# Patient Record
Sex: Female | Born: 1965
Health system: Southern US, Community
[De-identification: ages and names within clinical notes are randomized; demographics above are authoritative.]

## PROBLEM LIST (undated history)

## (undated) DIAGNOSIS — R7309 Other abnormal glucose: Secondary | ICD-10-CM

## (undated) DIAGNOSIS — A6000 Herpesviral infection of urogenital system, unspecified: Secondary | ICD-10-CM

## (undated) DIAGNOSIS — N3281 Overactive bladder: Secondary | ICD-10-CM

## (undated) DIAGNOSIS — E785 Hyperlipidemia, unspecified: Secondary | ICD-10-CM

## (undated) DIAGNOSIS — J302 Other seasonal allergic rhinitis: Secondary | ICD-10-CM

## (undated) DIAGNOSIS — B009 Herpesviral infection, unspecified: Secondary | ICD-10-CM

## (undated) DIAGNOSIS — Z860100 Personal history of colon polyps, unspecified: Secondary | ICD-10-CM

## (undated) DIAGNOSIS — Z8619 Personal history of other infectious and parasitic diseases: Secondary | ICD-10-CM

## (undated) HISTORY — PX: MYOMECTOMY: SHX85

## (undated) HISTORY — PX: REFRACTIVE SURGERY: SHX103

## (undated) HISTORY — DX: Other seasonal allergic rhinitis: J30.2

## (undated) HISTORY — DX: Personal history of other infectious and parasitic diseases: Z86.19

## (undated) HISTORY — PX: UPPER GI ENDOSCOPY: SHX6162

## (undated) HISTORY — DX: Overactive bladder: N32.81

## (undated) HISTORY — DX: Personal history of colonic polyps: Z86.010

## (undated) HISTORY — DX: Other abnormal glucose: R73.09

## (undated) HISTORY — PX: DILATION AND CURETTAGE OF UTERUS: SHX78

## (undated) HISTORY — PX: COLONOSCOPY: SHX174

## (undated) HISTORY — DX: Hyperlipidemia, unspecified: E78.5

## (undated) HISTORY — DX: Herpesviral infection, unspecified: B00.9

## (undated) HISTORY — DX: Herpesviral infection of urogenital system, unspecified: A60.00

## (undated) HISTORY — DX: Personal history of colon polyps, unspecified: Z86.0100

---
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
## 1998-09-08 ENCOUNTER — Other Ambulatory Visit: Admission: RE | Admit: 1998-09-08 | Discharge: 1998-09-08 | Payer: Self-pay | Admitting: Gynecology

## 1998-12-08 ENCOUNTER — Other Ambulatory Visit: Admission: RE | Admit: 1998-12-08 | Discharge: 1998-12-08 | Payer: Self-pay | Admitting: Gynecology

## 1999-08-26 ENCOUNTER — Other Ambulatory Visit: Admission: RE | Admit: 1999-08-26 | Discharge: 1999-08-26 | Payer: Self-pay | Admitting: Gynecology

## 1999-11-01 HISTORY — PX: BREAST BIOPSY: SHX20

## 1999-11-10 ENCOUNTER — Encounter: Admission: RE | Admit: 1999-11-10 | Discharge: 2000-02-08 | Payer: Self-pay | Admitting: Gynecology

## 1999-11-26 ENCOUNTER — Encounter (INDEPENDENT_AMBULATORY_CARE_PROVIDER_SITE_OTHER): Payer: Self-pay | Admitting: *Deleted

## 1999-11-26 ENCOUNTER — Encounter: Payer: Self-pay | Admitting: General Surgery

## 1999-11-26 ENCOUNTER — Ambulatory Visit (HOSPITAL_COMMUNITY): Admission: RE | Admit: 1999-11-26 | Discharge: 1999-11-26 | Payer: Self-pay | Admitting: General Surgery

## 2000-08-30 ENCOUNTER — Other Ambulatory Visit: Admission: RE | Admit: 2000-08-30 | Discharge: 2000-08-30 | Payer: Self-pay | Admitting: Gynecology

## 2000-12-26 ENCOUNTER — Encounter (INDEPENDENT_AMBULATORY_CARE_PROVIDER_SITE_OTHER): Payer: Self-pay

## 2000-12-26 ENCOUNTER — Ambulatory Visit (HOSPITAL_COMMUNITY): Admission: RE | Admit: 2000-12-26 | Discharge: 2000-12-26 | Payer: Self-pay | Admitting: Gynecology

## 2001-09-04 ENCOUNTER — Other Ambulatory Visit: Admission: RE | Admit: 2001-09-04 | Discharge: 2001-09-04 | Payer: Self-pay | Admitting: Gynecology

## 2002-02-27 ENCOUNTER — Encounter: Admission: RE | Admit: 2002-02-27 | Discharge: 2002-05-28 | Payer: Self-pay | Admitting: Gynecology

## 2002-09-18 ENCOUNTER — Inpatient Hospital Stay (HOSPITAL_COMMUNITY): Admission: AD | Admit: 2002-09-18 | Discharge: 2002-09-20 | Payer: Self-pay | Admitting: Gynecology

## 2002-09-23 ENCOUNTER — Encounter: Admission: RE | Admit: 2002-09-23 | Discharge: 2002-10-23 | Payer: Self-pay | Admitting: Gynecology

## 2002-11-05 ENCOUNTER — Other Ambulatory Visit: Admission: RE | Admit: 2002-11-05 | Discharge: 2002-11-05 | Payer: Self-pay | Admitting: Gynecology

## 2003-12-09 ENCOUNTER — Other Ambulatory Visit: Admission: RE | Admit: 2003-12-09 | Discharge: 2003-12-09 | Payer: Self-pay | Admitting: Gynecology

## 2004-01-15 ENCOUNTER — Ambulatory Visit (HOSPITAL_COMMUNITY): Admission: RE | Admit: 2004-01-15 | Discharge: 2004-01-15 | Payer: Self-pay | Admitting: Gynecology

## 2004-01-15 ENCOUNTER — Encounter (INDEPENDENT_AMBULATORY_CARE_PROVIDER_SITE_OTHER): Payer: Self-pay | Admitting: *Deleted

## 2004-01-15 ENCOUNTER — Ambulatory Visit (HOSPITAL_BASED_OUTPATIENT_CLINIC_OR_DEPARTMENT_OTHER): Admission: RE | Admit: 2004-01-15 | Discharge: 2004-01-15 | Payer: Self-pay | Admitting: Gynecology

## 2004-02-12 ENCOUNTER — Other Ambulatory Visit: Admission: RE | Admit: 2004-02-12 | Discharge: 2004-02-12 | Payer: Self-pay | Admitting: Gynecology

## 2004-05-26 ENCOUNTER — Other Ambulatory Visit: Admission: RE | Admit: 2004-05-26 | Discharge: 2004-05-26 | Payer: Self-pay | Admitting: Internal Medicine

## 2004-12-24 ENCOUNTER — Other Ambulatory Visit: Admission: RE | Admit: 2004-12-24 | Discharge: 2004-12-24 | Payer: Self-pay | Admitting: Gynecology

## 2005-12-26 ENCOUNTER — Other Ambulatory Visit: Admission: RE | Admit: 2005-12-26 | Discharge: 2005-12-26 | Payer: Self-pay | Admitting: Gynecology

## 2007-02-16 ENCOUNTER — Other Ambulatory Visit: Admission: RE | Admit: 2007-02-16 | Discharge: 2007-02-16 | Payer: Self-pay | Admitting: Gynecology

## 2007-08-20 ENCOUNTER — Other Ambulatory Visit: Admission: RE | Admit: 2007-08-20 | Discharge: 2007-08-20 | Payer: Self-pay | Admitting: Gynecology

## 2008-02-22 ENCOUNTER — Other Ambulatory Visit: Admission: RE | Admit: 2008-02-22 | Discharge: 2008-02-22 | Payer: Self-pay | Admitting: Gynecology

## 2008-09-17 ENCOUNTER — Other Ambulatory Visit: Admission: RE | Admit: 2008-09-17 | Discharge: 2008-09-17 | Payer: Self-pay | Admitting: Gynecology

## 2008-09-17 ENCOUNTER — Encounter: Payer: Self-pay | Admitting: Gynecology

## 2008-09-17 ENCOUNTER — Ambulatory Visit: Payer: Self-pay | Admitting: Gynecology

## 2008-11-07 ENCOUNTER — Encounter: Payer: Self-pay | Admitting: Internal Medicine

## 2009-02-26 ENCOUNTER — Other Ambulatory Visit: Admission: RE | Admit: 2009-02-26 | Discharge: 2009-02-26 | Payer: Self-pay | Admitting: Gynecology

## 2009-02-26 ENCOUNTER — Encounter: Payer: Self-pay | Admitting: Gynecology

## 2009-02-26 ENCOUNTER — Ambulatory Visit: Payer: Self-pay | Admitting: Gynecology

## 2009-02-26 LAB — CONVERTED CEMR LAB: Pap Smear: NORMAL

## 2009-10-23 ENCOUNTER — Encounter: Payer: Self-pay | Admitting: Internal Medicine

## 2009-11-06 ENCOUNTER — Ambulatory Visit: Payer: Self-pay | Admitting: Internal Medicine

## 2009-11-06 DIAGNOSIS — J309 Allergic rhinitis, unspecified: Secondary | ICD-10-CM | POA: Insufficient documentation

## 2009-11-06 DIAGNOSIS — Z86010 Personal history of colonic polyps: Secondary | ICD-10-CM | POA: Insufficient documentation

## 2009-11-06 DIAGNOSIS — A6000 Herpesviral infection of urogenital system, unspecified: Secondary | ICD-10-CM | POA: Insufficient documentation

## 2009-11-06 DIAGNOSIS — Z8669 Personal history of other diseases of the nervous system and sense organs: Secondary | ICD-10-CM | POA: Insufficient documentation

## 2009-11-06 DIAGNOSIS — R32 Unspecified urinary incontinence: Secondary | ICD-10-CM | POA: Insufficient documentation

## 2009-11-06 DIAGNOSIS — E785 Hyperlipidemia, unspecified: Secondary | ICD-10-CM

## 2009-11-06 HISTORY — DX: Herpesviral infection of urogenital system, unspecified: A60.00

## 2009-12-21 ENCOUNTER — Ambulatory Visit: Payer: Self-pay | Admitting: Internal Medicine

## 2009-12-21 LAB — CONVERTED CEMR LAB
ALT: 16 units/L (ref 0–35)
AST: 21 units/L (ref 0–37)
Albumin: 4 g/dL (ref 3.5–5.2)
Alkaline Phosphatase: 47 units/L (ref 39–117)
BUN: 11 mg/dL (ref 6–23)
Basophils Absolute: 0 10*3/uL (ref 0.0–0.1)
Basophils Relative: 0.6 % (ref 0.0–3.0)
Bilirubin Urine: NEGATIVE
Bilirubin, Direct: 0.1 mg/dL (ref 0.0–0.3)
Blood in Urine, dipstick: NEGATIVE
CO2: 28 meq/L (ref 19–32)
Calcium: 8.9 mg/dL (ref 8.4–10.5)
Chloride: 110 meq/L (ref 96–112)
Cholesterol: 223 mg/dL — ABNORMAL HIGH (ref 0–200)
Creatinine, Ser: 1 mg/dL (ref 0.4–1.2)
Direct LDL: 154.1 mg/dL
Eosinophils Absolute: 0.2 10*3/uL (ref 0.0–0.7)
Eosinophils Relative: 4 % (ref 0.0–5.0)
Folate: >20 ng/mL
Free T4: 0.8 ng/dL (ref 0.6–1.6)
GFR calc non Af Amer: 63.43 mL/min (ref >60)
Glucose, Bld: 109 mg/dL — ABNORMAL HIGH (ref 70–99)
Glucose, Urine, Semiquant: NEGATIVE
HCT: 39.8 % (ref 36.0–46.0)
HDL: 69.3 mg/dL (ref >39.00)
Hemoglobin: 13.2 g/dL (ref 12.0–15.0)
Ketones, urine, test strip: NEGATIVE
Lymphocytes Relative: 21.6 % (ref 12.0–46.0)
Lymphs Abs: 1.1 10*3/uL (ref 0.7–4.0)
MCHC: 33.2 g/dL (ref 30.0–36.0)
MCV: 91.8 fL (ref 78.0–100.0)
Monocytes Absolute: 0.4 10*3/uL (ref 0.1–1.0)
Monocytes Relative: 7.9 % (ref 3.0–12.0)
Neutro Abs: 3.4 10*3/uL (ref 1.4–7.7)
Neutrophils Relative %: 65.9 % (ref 43.0–77.0)
Nitrite: NEGATIVE
Platelets: 233 10*3/uL (ref 150.0–400.0)
Potassium: 4.4 meq/L (ref 3.5–5.1)
Protein, U semiquant: NEGATIVE
RBC: 4.34 M/uL (ref 3.87–5.11)
RDW: 13.1 % (ref 11.5–14.6)
Sodium: 141 meq/L (ref 135–145)
Specific Gravity, Urine: >=1.03
TSH: 1.79 microintl units/mL (ref 0.35–5.50)
Total Bilirubin: 0.8 mg/dL (ref 0.3–1.2)
Total CHOL/HDL Ratio: 3
Total Protein: 6.7 g/dL (ref 6.0–8.3)
Triglycerides: 82 mg/dL (ref 0.0–149.0)
Urobilinogen, UA: 0.2
VLDL: 16.4 mg/dL (ref 0.0–40.0)
Vitamin B-12: 417 pg/mL (ref 211–911)
WBC Urine, dipstick: NEGATIVE
WBC: 5.1 10*3/uL (ref 4.5–10.5)
pH: 5

## 2010-03-22 ENCOUNTER — Ambulatory Visit: Payer: Self-pay | Admitting: Internal Medicine

## 2010-03-22 DIAGNOSIS — R7309 Other abnormal glucose: Secondary | ICD-10-CM | POA: Insufficient documentation

## 2010-03-22 HISTORY — DX: Other abnormal glucose: R73.09

## 2010-03-22 LAB — CONVERTED CEMR LAB
Cholesterol, target level: 200 mg/dL
HDL goal, serum: 40 mg/dL
LDL Goal: 160 mg/dL

## 2010-03-30 ENCOUNTER — Ambulatory Visit: Payer: Self-pay | Admitting: Internal Medicine

## 2010-03-30 DIAGNOSIS — J029 Acute pharyngitis, unspecified: Secondary | ICD-10-CM | POA: Insufficient documentation

## 2010-07-29 ENCOUNTER — Other Ambulatory Visit: Admission: RE | Admit: 2010-07-29 | Discharge: 2010-07-29 | Payer: Self-pay | Admitting: Gynecology

## 2010-07-29 ENCOUNTER — Ambulatory Visit: Payer: Self-pay | Admitting: Gynecology

## 2010-11-23 ENCOUNTER — Telehealth: Payer: Self-pay | Admitting: *Deleted

## 2010-12-02 NOTE — Letter (Signed)
Summary: Records from Dr. Artis Flock 2007- 2010  Records from Dr. Artis Flock 2007- 2010   Imported By: Maryln Gottron 11/11/2009 14:42:06  _____________________________________________________________________  External Attachment:    Type:   Image     Comment:   External Document

## 2010-12-02 NOTE — Assessment & Plan Note (Signed)
Summary: SORE THROAT // RS   Vital Signs:  Patient profile:   45 year old female Menstrual status:  regular LMP:     03/06/2010 Weight:      137 pounds Temp:     98.3 degrees F oral Pulse rate:   60 / minute BP sitting:   120 / 80  (left arm) Cuff size:   regular  Vitals Entered By: Romualdo Bolk, CMA (AAMA) (Mar 30, 2010 10:00 AM) CC: Sore throat- exposed to strep by nephew over the weekend. No fever, no abd. pain, some congestion but no coughing LMP (date): 03/06/2010 LMP - Character: normal Menarche (age onset years): 10   Menses interval (days): 28 Menstrual flow (days): 4 Enter LMP: 03/06/2010 Last PAP Result normal   History of Present Illness: Breckyn Troyer comes in today  for sda  . Was camping  with nephew  and  just found out he had strep   by lab test  was seen 2 days ago.   Began having sore throat on 2 days ago  hurting to swallow .  and then progressed.  No cough congestion or fever . Has remote hx of strep and feels similarly . Hurts to swallow and glands tender.  no rash.  Preventive Screening-Counseling & Management  Alcohol-Tobacco     Alcohol drinks/day: <1     Alcohol type: beer     Smoking Status: quit     Year Quit: 2007  Caffeine-Diet-Exercise     Caffeine use/day: 2     Does Patient Exercise: no  Current Medications (verified): 1)  Multivitamins   Tabs (Multiple Vitamin) 2)  Aspirin 81 Mg  Tabs (Aspirin) 3)  Calcium Carbonate-Vitamin D 600-400 Mg-Unit  Tabs (Calcium Carbonate-Vitamin D)  Allergies (verified): 1)  Thimerosal (Thimerosal)  Past History:  Past medical, surgical, family and social histories (including risk factors) reviewed for relevance to current acute and chronic problems.  Past Medical History: Reviewed history from 11/06/2009 and no changes required. Hyperlipidemia Urinary incontinence Allergic rhinitis  seasonal  Colonic polyps, hx of" precancer" Hx of vasovagal syncope G4P2 Chicken pox as a child  Past  Surgical History: Reviewed history from 11/06/2009 and no changes required. Tonsillectomy 1971 Fibroids removed breast bx  in Rt Breast in 1998 Miscarriage D&C 1999 P4 G2    Past History:  Care Management: Gynecology: Beatrix Shipper   Gastroenterology: Loreta Ave Dermatology: Hortense Ramal Urology: Posey Rea of name- [facility]  Family History: Reviewed history from 11/06/2009 and no changes required. Father: Colon Ca Mother: Asthma Siblings: Sister (Half)- Too Many WBC PGM : CD in 50s  Neg SCD  Social History: Reviewed history from 03/22/2010 and no changes required. 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    Physical Exam  General:  alert, well-developed, well-nourished, and well-hydrated.   Head:  normocephalic and atraumatic.   Eyes:  vision grossly intact.   Ears:  R ear normal and L ear normal.   Nose:  no external deformity and no nasal discharge.   Mouth:  fiery red posterior soft palate  no ulcer and no exudate   no edema Neck:  tender  ac nodes no pc nodes  Lungs:  normal respiratory effort,  no intercostal retractions, no accessory muscle use, and normal breath sounds.   Skin:  turgor normal, color normal, no ecchymoses, and no petechiae.   Cervical Nodes:  no posterior cervical adenopathy.  tender ac nodes.  Psych:  Oriented X3, good eye contact, not anxious appearing, and not depressed appearing.     Impression & Recommendations:  Problem # 1:  PHARYNGITIS, ACUTE (ICD-462) options discussed regarding    eval and rx  and will go ahead with empiric rx with hx of exposure andclinical exam CW  dx .  Benefit more than risk with empiric rx.    Expectant management    Her updated medication list for this problem includes:    Aspirin 81 Mg Tabs (Aspirin)    Amoxicillin 500 Mg Caps (Amoxicillin) .Marland Kitchen... 1 by mouth two times a day  Problem # 2:  COMMUNICABLE DISEASE, EXPOSURE TO GP A STREP (ICD-V01.9) see above.  Complete Medication List: 1)  Multivitamins Tabs (Multiple vitamin) 2)  Aspirin 81 Mg Tabs (Aspirin) 3)  Calcium Carbonate-vitamin D 600-400 Mg-unit Tabs (Calcium carbonate-vitamin d) 4)  Amoxicillin 500 Mg Caps (Amoxicillin) .Marland Kitchen.. 1 by mouth two times a day  Patient Instructions: 1)  take the full 10 days of antibioitic for presumed strep  throat infection. 2)  call if recurring   fever  or other concerns. Prescriptions: AMOXICILLIN 500 MG CAPS (AMOXICILLIN) 1 by mouth two times a day  #20 x 0   Entered and Authorized by:   Madelin Headings MD   Signed by:   Madelin Headings MD on 03/30/2010   Method used:   [facility]        [facility]  [place] [address] [facility]       [address]       [place], [place]  [address]       Ph: [phone] or [phone]       Fax: [phone]   RxID:   [id number]

## 2010-12-02 NOTE — Assessment & Plan Note (Signed)
Summary: cpx/mm pt rsc/njr   Vital Signs:  Patient profile:   45 year old female Menstrual status:  regular LMP:     03/06/2010 Height:      64.5 inches Weight:      141 pounds BMI:     23.91 Temp:     97.9 degrees F oral BP sitting:   110 / 80  (right arm)  Vitals Entered By: Kathrynn Speed CMA (Mar 22, 2010 10:02 AM) CC: CPX/ labs, Lipid Management LMP (date): 03/06/2010 LMP - Character: normal Menarche (age onset years): 10   Menses interval (days): 28 Menstrual flow (days): 4 Enter LMP: 03/06/2010 Last PAP Result normal   History of Present Illness: Danielle Graham come in  today for  preventive visit . Since last visit  here  there have been no major changes in health status  .   no new problems . thinks memeory problem is more related to lifestyle  and sleep and stress . No sig exercise at present.   Lipid Management History:      Negative NCEP/ATP III risk factors include female age less than 30 years old, HDL cholesterol greater than 60, and non-tobacco-user status.     Preventive Screening-Counseling & Management  Alcohol-Tobacco     Alcohol drinks/day: <1     Alcohol type: beer     Smoking Status: quit     Year Quit: 2007  Caffeine-Diet-Exercise     Caffeine use/day: 2     Does Patient Exercise: no  Hep-HIV-STD-Contraception     Dental Visit-last 6 months yes     Sun Exposure-Excessive: no  Safety-Violence-Falls     Seat Belt Use: yes     Firearms in the Home: firearms in the home     Firearm Counseling: not indicated; uses recommended firearm safety measures     Smoke Detectors: yes  Current Medications (verified): 1)  Multivitamins   Tabs (Multiple Vitamin) 2)  Aspirin 81 Mg  Tabs (Aspirin) 3)  Calcium Carbonate-Vitamin D 600-400 Mg-Unit  Tabs (Calcium Carbonate-Vitamin D)  Allergies (verified): 1)  Thimerosal (Thimerosal)  Past History:  Past medical, surgical, family and social histories (including risk factors) reviewed, and no changes noted  (except as noted below).  Past Medical History: Reviewed history from 11/06/2009 and no changes required. Hyperlipidemia Urinary incontinence Allergic rhinitis  seasonal  Colonic polyps, hx of" precancer" Hx of vasovagal syncope G4P2 Chicken pox as a child  Past Surgical History: Reviewed history from 11/06/2009 and no changes required. Tonsillectomy 1971 Fibroids removed breast bx  in Rt Breast in 1998 Miscarriage D&C 1999 P4 G2    Past History:  Care Management: Gynecology: Beatrix Shipper   Gastroenterology: Loreta Ave Dermatology: Hortense Ramal Urology: Posey Rea of name- [facility]  Family History: Reviewed history from 11/06/2009 and no changes required. Father: Colon Ca Mother: Asthma Siblings: Sister (Half)- Too Many WBC PGM : CD in 73s  Neg SCD  Social History: Reviewed history from 11/06/2009 and no changes required. 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         stress incontinence  has seen urology  Physical Exam General Appearance: well developed, well nourished, no acute distress Eyes: conjunctiva and lids normal, PERRLA, EOMI, WNL Ears, Nose, Mouth, Throat: TM clear, nares clear, oral exam WNL Neck: supple, no lymphadenopathy, no thyromegaly, no JVD Respiratory: clear to auscultation and percussion,  respiratory effort normal Cardiovascular: regular rate and rhythm, S1-S2, no murmur, rub or gallop, no bruits, peripheral pulses normal and symmetric, no cyanosis, clubbing, edema or varicosities Chest: no scars, masses, tenderness; no asymmetry, skin changes, nipple discharge   Gastrointestinal: soft, non-tender; no hepatosplenomegaly, masses; active bowel sounds all quadrants,  Genitourinary: per gyne Lymphatic: no cervical, axillary or inguinal adenopathy Musculoskeletal: gait normal, muscle tone and strength WNL, no joint swelling, effusions, discoloration, crepitus  Skin: clear, good turgor, color WNL, no rashes, lesions, or ulcerations Neurologic: normal mental status, normal reflexes, normal strength, sensation, and motion Psychiatric: alert; oriented to person, place and time Other Exam:  labs reviewed     Impression & Recommendations:  Problem # 1:  HEALTH MAINTENANCE EXAM, ADULT (ICD-V70.0) Discussed nutrition,exercise,diet,healthy weight, vitamin D and calcium.   Problem # 2:  HYPERLIPIDEMIA (ICD-272.4) lifestyle intervention  Labs Reviewed: SGOT: 21 (12/21/2009)   SGPT: 16 (12/21/2009)  Lipid Goals: Chol Goal: 200 (03/22/2010)   HDL Goal: 40 (03/22/2010)   LDL Goal: 160 (03/22/2010)   TG Goal: 150 (03/22/2010)  10 Yr Risk Heart Disease: Not enough information   HDL:69.30 (12/21/2009)  Chol:223 (12/21/2009)  Trig:82.0 (12/21/2009)  Problem # 3:  URINARY INCONTINENCE (ICD-788.30) stress  IC  Problem # 4:  HYPERGLYCEMIA, FASTING (ICD-790.29) disc    neg fam   dm    Complete Medication List: 1)  Multivitamins Tabs (Multiple vitamin) 2)  Aspirin 81 Mg Tabs (Aspirin) 3)  Calcium Carbonate-vitamin D 600-400 Mg-unit Tabs (Calcium carbonate-vitamin d)  Lipid Assessment/Plan:      Based on NCEP/ATP III, the patient's risk factor category is "0-1 risk factors".  The patient's lipid goals are as follows: Total cholesterol goal is 200; LDL cholesterol goal is 160; HDL  cholesterol goal is 40; Triglyceride goal is 150.     Patient Instructions: 1)  7-8 hours of sleep  2)  Aerobic exercise  3)  Kegels and get back with the urologist/ Gyne if needed for the incontinence.  4)  avoid processed foods .  5)  Have your lipids and blood sugar checked yearly

## 2010-12-02 NOTE — Progress Notes (Signed)
Summary: seasickness patch  Phone Note Call from Patient   Caller: Patient Call For: Danielle Headings MD Summary of Call: Asking for seasickness patches. [facility] [phone] Initial call taken by: [facility] CMA AAMA,  November 23, 2010 1:23 PM  Follow-up for Phone Call        Need to know what they are for and how long she will be gone if they are for a cruise. Left message to call back about this. Follow-up by: Romualdo Bolk, CMA (AAMA),  November 23, 2010 1:40 PM  Additional Follow-up for Phone Call Additional follow up Details #1::        Pt is going on a cruise for 8 days. Additional Follow-up by: Romualdo Bolk, CMA Duncan Dull),  November 23, 2010 2:14 PM    Additional Follow-up for Phone Call Additional follow up Details #2::    trans derm scopolomine patches  disp 3   appl before cruise and remove after 72 hours  Follow-up by: Danielle Headings MD,  November 25, 2010 1:15 PM  Additional Follow-up for Phone Call Additional follow up Details #3:: Details for Additional Follow-up Action Taken: Pt aware and rx sent to pharmacy Additional Follow-up by: Romualdo Bolk, CMA (AAMA),  November 25, 2010 2:05 PM  New/Updated Medications: TRANSDERM-SCOP 1.5 MG PT72 (SCOPOLAMINE BASE) Apply before cruise and wear for 72 hours. Prescriptions: TRANSDERM-SCOP 1.5 MG PT72 (SCOPOLAMINE BASE) Apply before cruise and wear for 72 hours.  #3 x 0   Entered by:   Romualdo Bolk, CMA (AAMA)   Authorized by:   Danielle Headings MD   Signed by:   Romualdo Bolk, CMA (AAMA) on 11/25/2010   Method used:   [facility]        [facility]  [place] [address] [facility]       [address]       [place], [place]  [address]       Ph: [phone] or [phone]       Fax: [phone]   RxID:   [id number]

## 2010-12-02 NOTE — Assessment & Plan Note (Signed)
Summary: NEWTOEST/MHF   Vital Signs:  Patient profile:   45 year old female Menstrual status:  regular LMP:     10/28/2009 Height:      64.5 inches Weight:      141 pounds BMI:     23.91 Pulse rate:   60 / minute BP sitting:   120 / 80  (left arm) Cuff size:   regular  Vitals Entered By: Romualdo Bolk, CMA (AAMA) (November 06, 2009 8:33 AM) CC: New Pt to get establish. Pt is concerned about her mind not being as sharp as it used to be and has more trouble focusing. She has also noticed short term memory loss. LMP (date): 10/28/2009 LMP - Character: normal Menarche (age onset years): 10   Menses interval (days): 28 Menstrual flow (days): 4 Menstrual Status regular Enter LMP: 10/28/2009 Last PAP Result normal   History of Present Illness: Danielle Graham comesin today for  above for a firt time visit.  . Her previous PCP was Dr Melrose Nakayama who changed his practice . LAst check and labs over a year ago. SHe gets Gye checks with Dr Lily Peer.  Lance Bosch has no chronic disease requiring medication.  She has been concerned over the recent past that she is not as sharp at work and getting things done. Short term memory difficult. No acute events and sleep no change.  very busy with obligations and work . Etoh on weekends. No head injuries but did drink in college to [employer].  No vision change depression . Tends to be a worrier all the time but no LD or panic disorder or rx fro such.  No change in abilty to orine and calculate but more difficult to do mental math in her head.  LIPIds:  Has had elevated readings   but  in past  had  lipomed   with good particle size . No heart or lung disease.      Preventive Care Screening  Colonoscopy:    Date:  10/22/2009    Results:  normal   Pap Smear:    Date:  02/26/2009    Results:  normal   Mammogram:    Date:  12/01/2008    Results:  normal   Last Tetanus Booster:    Date:  10/31/2000    Results:  Historical    Preventive  Screening-Counseling & Management  Alcohol-Tobacco     Alcohol drinks/day: <1     Alcohol type: beer     Smoking Status: quit     Year Quit: 2007  Caffeine-Diet-Exercise     Caffeine use/day: 5     Does Patient Exercise: no  Hep-HIV-STD-Contraception     Dental Visit-last 6 months yes     Sun Exposure-Excessive: no  Safety-Violence-Falls     Seat Belt Use: yes     Firearms in the Home: firearms in the home     Firearm Counseling: not indicated; uses recommended firearm safety measures     Smoke Detectors: yes      Drug Use:  no.    Current Medications (verified): 1)  Multivitamins   Tabs (Multiple Vitamin) 2)  Aspirin 81 Mg  Tabs (Aspirin) 3)  Calcium Carbonate-Vitamin D 600-400 Mg-Unit  Tabs (Calcium Carbonate-Vitamin D)  Allergies (verified): 1)  Thimerosal (Thimerosal)  Past History:  Past Medical History: Hyperlipidemia Urinary incontinence Allergic rhinitis  seasonal  Colonic polyps, hx of" precancer" Hx of vasovagal syncope G4P2 Chicken pox as a child  Past Surgical History: Tonsillectomy  1971 Fibroids removed breast bx  in Rt Breast in 1998 Miscarriage D&C 1999 P4 G2    Past History:  Care Management: Gynecology: Beatrix Shipper Gastroenterology: Lore[facility] Ave Dermatology: Hortense Ramal Urology: Posey Rea of name- [facility]  Family History: Father: Colon Ca Mother: Asthma Siblings: Sister (Half)- Too Many WBC PGM : CD in 47s  Neg SCD  Social History: Married Former Smoker Alcohol use-yes-Social-Weekend only Drug use-no Regular exercise-no HH of 4   2 children and husband  1 dog  Occupation:  Leisure centre manager  bs degree Smoking Status:  quit Caffeine use/day:  5 Does Patient Exercise:  no Drug Use:  no Seat Belt Use:  yes Dental Care w/in 6 mos.:  yes Sun Exposure-Excessive:  no Occupation:  employed  Review of Systems  The patient denies anorexia, fever, weight loss, weight gain, vision loss, decreased hearing, hoarseness, chest pain,  dyspnea on exertion, peripheral edema, prolonged cough, headaches, hemoptysis, abdominal pain, melena, hematochezia, severe indigestion/heartburn, hematuria, genital sores, muscle weakness, transient blindness, difficulty walking, depression, unusual weight change, abnormal bleeding, enlarged lymph nodes, angioedema, and breast masses.         hx of fainting when sees blood or severe pain.   Physical Exam  General:  Well-developed,well-nourished,in no acute distress; alert,appropriate and cooperative throughout examination Head:  normocephalic and atraumatic.   Eyes:  vision grossly intact, pupils equal, pupils round, and pupils reactive to light.   Ears:  R ear normal and L ear normal.   Nose:  no external deformity and no external erythema.   Mouth:  good dentition and pharynx pink and moist.   Neck:  No deformities, masses, or tenderness noted. Lungs:  Normal respiratory effort, chest expands symmetrically. Lungs are clear to auscultation, no crackles or wheezes.no dullness.   Heart:  Normal rate and regular rhythm. S1 and S2 normal without gallop, murmur, click, rub or other extra sounds.no lifts.   Abdomen:  Bowel sounds positive,abdomen soft and non-tender without masses, organomegaly or  noted. Pulses:  pulses intact without delay   Extremities:  no clubbing cyanosis or edema  Neurologic:  non focal alert & oriented X3, cranial nerves II-XII intact, strength normal in all extremities, gait normal, DTRs symmetrical and normal, finger-to-nose normal, and Romberg negative.   Skin:  turgor normal, color normal, no ecchymoses, and no petechiae.   Cervical Nodes:  No lymphadenopathy noted Psych:  Oriented X3, normally interactive, good eye contact, not anxious appearing, and not depressed appearing.     Impression & Recommendations:  Problem # 1:  MEMORY  DIFFICULTIES (ICD-780.93) self percieved in a high functioning person.   poss  related to lifestyle  and obligations  doesnt sound  organic but will check labs and follow over time.  Disx lifestyle intervention  to help STM.  Exam today  is normal.  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   Problem # 2:  HYPERLIPIDEMIA (ICD-272.4) review re ord and plan repeat and cpx.   Problem # 3:  ALLERGIC RHINITIS (ICD-477.9) Assessment: Comment Only  Problem # 4:  COLONIC POLYPS, HX OF (ICD-V12.72) counseled she is concerned about waiting 5 years for retest with her family hx and says the polyp was a preecancerous  type.  She will discuss withe Dr L[address]ta [address].  disc flu shot today .   reaction to thimerisol was related to this in contact solution and eye reaction as a teen .   HAs had td without se.   Problem # 6:  NEOPLASM, MALIGNANT, COLON, FAMILY  HX, FATHER (ICD-V16.0)  Problem # 7:  SYNCOPE, HX OF (ICD-V12.49) sounds VV   and stable.   Complete Medication List: 1)  Multivitamins Tabs (Multiple vitamin) 2)  Aspirin 81 Mg Tabs (Aspirin) 3)  Calcium Carbonate-vitamin D 600-400 Mg-unit Tabs (Calcium carbonate-vitamin d)   Patient Instructions: 1)  plan fasting labs     2)  CPX  plus  free T4 and folic acid and B12  3)  Dx Z61.0, memory problems  4)  Increase exercise and sleep and limit alcohol  5)  cpx in about a month  6)  can use any  2  slots . 7)  Call in meantime as needed.

## 2011-02-01 ENCOUNTER — Telehealth: Payer: Self-pay | Admitting: Internal Medicine

## 2011-02-01 NOTE — Telephone Encounter (Signed)
Per Dr. Fabian Sharp- okay to work in.

## 2011-02-01 NOTE — Telephone Encounter (Signed)
Pt needs cpx asap. Can I create 30 min slot ? °

## 2011-02-08 NOTE — Telephone Encounter (Signed)
lmom for pt to call to sch cpx

## 2011-02-18 ENCOUNTER — Encounter: Payer: Self-pay | Admitting: Internal Medicine

## 2011-02-22 ENCOUNTER — Other Ambulatory Visit (INDEPENDENT_AMBULATORY_CARE_PROVIDER_SITE_OTHER): Payer: BC Managed Care – PPO | Admitting: Internal Medicine

## 2011-02-22 DIAGNOSIS — Z0000 Encounter for general adult medical examination without abnormal findings: Secondary | ICD-10-CM

## 2011-02-22 DIAGNOSIS — E785 Hyperlipidemia, unspecified: Secondary | ICD-10-CM

## 2011-02-22 LAB — BASIC METABOLIC PANEL
BUN: 14 mg/dL (ref 6–23)
CO2: 27 mEq/L (ref 19–32)
Calcium: 9 mg/dL (ref 8.4–10.5)
Chloride: 106 mEq/L (ref 96–112)
Creatinine, Ser: 0.7 mg/dL (ref 0.4–1.2)
GFR: 90.74 mL/min (ref >60.00)
Glucose, Bld: 93 mg/dL (ref 70–99)
Potassium: 4.6 mEq/L (ref 3.5–5.1)
Sodium: 138 mEq/L (ref 135–145)

## 2011-02-22 LAB — POCT URINALYSIS DIPSTICK
Bilirubin, UA: NEGATIVE
Leukocytes, UA: NEGATIVE
Nitrite, UA: NEGATIVE
Protein, UA: NEGATIVE
pH, UA: 6

## 2011-02-22 LAB — CBC WITH DIFFERENTIAL/PLATELET
Basophils Absolute: 0 10*3/uL (ref 0.0–0.1)
Basophils Relative: 0.6 % (ref 0.0–3.0)
Eosinophils Absolute: 0.3 10*3/uL (ref 0.0–0.7)
Eosinophils Relative: 4 % (ref 0.0–5.0)
HCT: 39.1 % (ref 36.0–46.0)
Hemoglobin: 13.4 g/dL (ref 12.0–15.0)
Lymphocytes Relative: 26 % (ref 12.0–46.0)
Lymphs Abs: 1.7 10*3/uL (ref 0.7–4.0)
MCHC: 34.4 g/dL (ref 30.0–36.0)
MCV: 91.1 fl (ref 78.0–100.0)
Monocytes Absolute: 0.6 10*3/uL (ref 0.1–1.0)
Monocytes Relative: 8.6 % (ref 3.0–12.0)
Neutro Abs: 4 10*3/uL (ref 1.4–7.7)
Neutrophils Relative %: 60.8 % (ref 43.0–77.0)
Platelets: 269 10*3/uL (ref 150.0–400.0)
RBC: 4.29 Mil/uL (ref 3.87–5.11)
RDW: 14.4 % (ref 11.5–14.6)
WBC: 6.7 10*3/uL (ref 4.5–10.5)

## 2011-02-22 LAB — HEPATIC FUNCTION PANEL
AST: 17 U/L (ref 0–37)
Alkaline Phosphatase: 42 U/L (ref 39–117)
Bilirubin, Direct: 0.1 mg/dL (ref 0.0–0.3)
Total Protein: 6.4 g/dL (ref 6.0–8.3)

## 2011-02-22 LAB — TSH: TSH: 1.45 u[IU]/mL (ref 0.35–5.50)

## 2011-02-22 LAB — LIPID PANEL
HDL: 65.7 mg/dL (ref >39.00)
VLDL: 20.2 mg/dL (ref 0.0–40.0)

## 2011-02-23 ENCOUNTER — Encounter: Payer: Self-pay | Admitting: Internal Medicine

## 2011-03-01 ENCOUNTER — Encounter: Payer: Self-pay | Admitting: Internal Medicine

## 2011-03-01 ENCOUNTER — Ambulatory Visit (INDEPENDENT_AMBULATORY_CARE_PROVIDER_SITE_OTHER): Payer: BC Managed Care – PPO | Admitting: Internal Medicine

## 2011-03-01 VITALS — BP 120/80 | HR 66 | Ht 64.25 in | Wt 138.0 lb

## 2011-03-01 DIAGNOSIS — E785 Hyperlipidemia, unspecified: Secondary | ICD-10-CM

## 2011-03-01 DIAGNOSIS — J309 Allergic rhinitis, unspecified: Secondary | ICD-10-CM

## 2011-03-01 DIAGNOSIS — Z860100 Personal history of colon polyps, unspecified: Secondary | ICD-10-CM

## 2011-03-01 DIAGNOSIS — J302 Other seasonal allergic rhinitis: Secondary | ICD-10-CM | POA: Insufficient documentation

## 2011-03-01 DIAGNOSIS — Z0000 Encounter for general adult medical examination without abnormal findings: Secondary | ICD-10-CM

## 2011-03-01 DIAGNOSIS — R32 Unspecified urinary incontinence: Secondary | ICD-10-CM

## 2011-03-01 MED ORDER — TETANUS-DIPHTH-ACELL PERTUSSIS 5-2-15.5 LF-MCG/0.5 IM SUSP: 0.5000 mL | Freq: Once | INTRAMUSCULAR | Status: DC

## 2011-03-01 NOTE — Progress Notes (Signed)
Subjective:    Patient ID: Danielle Graham, female    DOB: Dec 28, 1965, 45 y.o.   MRN: [id number]  HPI Patient comes in today for a preventive visit couple issues. Since her last visit she has had no major changes in her health history. She is still battling frequent urination and dribbling. No major injuries hospitalizations. She sees Dr. Lily Peer for her gynecologist. Not doing a lot of regular exercise is busy. She's a new job doing well but sometimes is stressed and it affects her sleep. She sees Dr. caffeine per dermatology. Father died of colon cancer last year and she had a colonoscopy.  She is now on a 5 year recall.  Review of Systems stress and  Heart palpitation  Or edgy with loud noise . And  UI    Leaking and refruency.   Some urgency also with cough and sneeze and wears panty liner.  Sling  Suggested in past.  Vision readers needed.  Back pain at times when she lifts heavy objects or plays golf sometimes no radiation no persistence.  Past Medical History  Diagnosis Date  . Hyperlipidemia   . Allergic rhinitis, seasonal   . Urinary incontinence   . History of colonic polyps     pre cancer  . History of chicken pox    Past Surgical History  Procedure Date  . Tonsillectomy 1971  . Myomectomy   . Breast biopsy 1998    rt  . Dilation and curettage of uterus     miscarriage 1999    reports that she has quit smoking. She does not have any smokeless tobacco history on file. She reports that she drinks alcohol. She reports that she does not use illicit drugs. family history includes Asthma in her mother; Colon cancer in her father; Heart disease in her paternal grandmother; and Other in an unspecified family member. Allergies  Allergen Reactions  . Thimerosal     REACTION: from contact  solution  .         Objective:   Physical Exam Physical Exam: Vital signs reviewed MVH:QION is a well-developed well-nourished alert cooperative  white female who appears her stated  age in no acute distress.  HEENT: normocephalic  traumatic , Eyes: PERRL EOM's full, conjunctiva clear, Nares: paten,t no deformity discharge or tenderness., Ears: no deformity EAC's clear TMs with normal landmarks. Mouth: clear OP, no lesions, edema.  Moist mucous membranes. Dentition in adequate repair. NECK: supple without masses, thyromegaly or bruits. CHEST/PULM:  Clear to auscultation and percussion breath sounds equal no wheeze , rales or rhonchi. No chest wall deformities or tenderness. Breast: normal by inspection . No dimpling, discharge, masses, tenderness or discharge . LN: no cervical axillary inguinal adenopathy CV: PMI is nondisplaced, S1 S2 no gallops, murmurs, rubs. Peripheral pulses are full without delay.No JVD .  ABDOMEN: Bowel sounds normal nontender  No guard or rebound, no hepato splenomegal no CVA tenderness.  No hernia. Extremtities:  No clubbing cyanosis or edema, no acute joint swelling or redness no focal atrophy NEURO:  Oriented x3, cranial nerves 3-12 appear to be intact, no obvious focal weakness,gait within normal limits no abnormal reflexes or asymmetrical SKIN: No acute rashes normal turgor, color, no bruising or petechiae. PSYCH: Oriented, good eye contact, no obvious depression anxiety, cognition and judgment appear normal. Labs reviewed  Ld in 147 range ratio 4  EKG NSR brady Had mammo 4 2012   Needed mag views but was normal.  Assessment & Plan:  Preventive Health Care LIPIDS  Could be better but not high   Risk . Sleep situational with work and poss menopause.   Counseled. About sleep hygiene  UI Colonic polyps   family history

## 2011-03-01 NOTE — Patient Instructions (Signed)
Continue lifestyle intervention healthy eating and exercise .  Intensified this to help with her cholesterol levels. Touch base with Dr. Lily Peer about your bladder issues Review sleep hygiene  regular exercise is helpful for sleep. Recheck one -2 year or as needed.

## 2011-03-18 NOTE — Discharge Summary (Signed)
   NAME:  Danielle Graham, Danielle Graham                            ACCOUNT NO.:  [id number]   MEDICAL RECORD NO.:  [id number]                   PATIENT TYPE:  INP   LOCATION:  9121                                 FACILITY:  [facility]   PHYSICIAN:  Juan H. Lily Peer, M.D.             DATE OF BIRTH:  04-04-1966   DATE OF ADMISSION:  09/18/2002  DATE OF DISCHARGE:  09/20/2002                                 DISCHARGE SUMMARY   DISCHARGE. DIAGNOSES:  1. Intrauterine pregnancy at term.  2. Positive antiphospholipid.  3. Non-reassuring fetal heart rate tones.   PROCEDURE:  Outlet forceps assisted vaginal delivery with delivery of viable  infant.   HISTORY OF PRESENT ILLNESS:  The patient is a 45 year old gravida 4, para 1-  0-2-1, with a last menstrual period of 12/18/01, EDC of 09/25/02.  Prenatal  risk factors include a history of positive antiphospholipid antibody,  recurrent pregnancy losses, advanced maternal age.   LABORATORY DATA:  Blood type A positive, antibody screen negative.  RPR and  HIV nonreactive.   HOSPITAL COURSE:  The patient presented at 39 weeks in labor.  Delivery was  accomplished per outlet forceps assisted vaginal delivery secondary to fetal  bradycardia.  The patient delivered an Apgar 7/9 female infant weighing 8  pounds 4 ounces.  Nuchal cord x1 reduced over a midline episiotomy.  Postpartum course:  The patient remained afebrile, had no difficulty  voiding, was able to be discharged in satisfactory condition on her second  postpartum day.  CBC showed hematocrit of 35.4, hemoglobin 12.2, white blood  cell count 14.1, platelets 191.   DISPOSITION:  Follow up in six weeks.   DISCHARGE MEDICATIONS:  1. Continue prenatal vitamins and iron.  2. Motrin for pain.     Elwyn Lade . Hancock, N.P.                Gaetano Hawthorne. Lily Peer, M.D.    MKH/MEDQ  D:  10/28/2002  T:  10/28/2002  Job:  [id number]

## 2011-03-18 NOTE — Op Note (Signed)
NAME:  Danielle Graham, Danielle Graham                            ACCOUNT NO.:  [id number]   MEDICAL RECORD NO.:  [id number]                   PATIENT TYPE:  AMB   LOCATION:  [facility]                                 FACILITY:  [facility]   PHYSICIAN:  Juan H. Lily Peer, M.D.             DATE OF BIRTH:  [birth year]   DATE OF PROCEDURE:  01/15/2004  DATE OF DISCHARGE:                                 OPERATIVE REPORT   SURGEON:  Juan H. Lily Peer, M.D.   INDICATIONS FOR PROCEDURE:  A 45 year old, gravida 4, para 2 with recent Pap  smear demonstrating glandular cell abnormality and the description of the  cytologist was atypical endoglandular cells present which were suspicious  for endocervical gland dysplasia or adenocarcinoma in situ.   PREOPERATIVE DIAGNOSES:  AGUS.   POSTOPERATIVE DIAGNOSES:  AGUS.   ANESTHESIA:  MAC and paracervical block.   SURGEON:  Juan H. Lily Peer, M.D.   PROCEDURE:  Cold knife cervical conization.   DESCRIPTION OF PROCEDURE:  After the patient was adequately counseled, she  was taken to the operating room where she underwent intravenous sedation.  Her legs were placed in the high lithotomy position, the vagina and external  genitalia were cleansed in the usual sterile fashion.  A weighted speculum  was placed in the posterior vaginal vault, a Sims retractor in the anterior  vaginal wall and two lateral retractors for exposure.  Lugol solution was  applied to the ectocervix, there was no evidence of any decreased uptake.  One percent lidocaine with 1:200,000 epinephrine was previously infiltrated  into the cervical stroma at the 2, 4, 8 and 10 o'clock positions for a total  of 10 mL.  The first step of the procedure consisted of doing a very  vigorous ECC and the second specimen was an endometrial biopsy with a  Pipelle and the third specimen was the cervical __________. The procedure  was undertaken with a knife and in a cylindrical fashion, a cone was wedge  out of the cervix and  was pinned in a cork and passed off the operative  field for pathological evaluation. The cervical bed was cauterized with ball  electrodes followed by placement of Monsel solution and Amino-Cerv cream.  The patient tolerated the procedure well and was taken to the recovery room  with stable vital signs. Blood loss was minimal.  Fluid resuscitation  consisted of 700 mL of lactated Ringer's.                                               Juan H. Lily Peer, M.D.    JHF/MEDQ  D:  01/15/2004  T:  01/16/2004  Job:  [id number]

## 2011-03-18 NOTE — Op Note (Signed)
[facility]  Patient:    ARDICE BOYAN                          MRN: [id number] Proc. Date: 11/26/99 Adm. Date:  [id number] Disc. Date: [id number] Attending:  Arlis Porta                           Operative Report  PREOPERATIVE DIAGNOSES: 1. Nonpalpable right breast lesion at 12 oclock position. 2. Palpable right breast mass at the three oclock position.  POSTOPERATIVE DIAGNOSES: 1. Nonpalpable right breast lesion at 12 oclock position. 2. Palpable right breast mass at the three oclock position.  PROCEDURE:  Right partial mastectomy after needle localization.  SURGEON:  Adolph Pollack, M.D.  ANESTHESIA:  General.  INDICATIONS:  This is a 45 year old female who went for a screening mammogram, nd was found to have a nonpalpable lesion at the 12 oclock position that was solid. On presentation in my office, she is also found to have a solid lesion at the three oclock position.  A biopsy of both sites was recommended and she agreed.  DESCRIPTION OF PROCEDURE:  After successful needle localization in which the lesion was found to be approximately 7 cm below the skin.  The patient was brought to he operating room and given a general anesthetic.  The three oclock lesion was marked and the breast was sterilely prepped and draped.  A curvilinear incision was made just medial to both the wire and the three oclock lesion in the upper inner quadrant of the right breast.  This was carried through the dermis sharply. Flaps were raised in all directions and I was able to pull the wire through the previous puncture wound into this open wound.  I then excised a cylinder shaped specimen of breast tissue around the wire which went quite deep all the way down to the chest wall, and sent this for specimen mammography.  The specimen mammography demonstrated that the lesion was within the specimen that was given to them.  Subsequently, I  extended the incision a little bit farther and was able to sharply excise the palpable lesion at the three oclock position.  By the time _____ removed approximately 20% of the breast tissue, thus partial mastectomy.  Next, I spent time for hemostasis.  I noted hemostasis was able to be obtained nd sustained with electrocautery and interrupted 3-0 Vicryl figure-of-eight sutures at bleeding points.  I inspected this multiple times and irrigated, and once hemostasis was adequate, I closed the wound by approximating the subcutaneous fat with interrupted 3-0 Vicryl sutures and closed the skin with 4-0 Monocryl subcuticular stitch followed by Steri-Strips, and a sterile dressing.  An Ace wrap was then placed around the patient.  She tolerated the procedure well without any apparent complications, and was taken to the recovery room in satisfactory condition. DD:  11/26/99 TD:  11/28/99 Job: [id number]

## 2011-03-18 NOTE — Op Note (Signed)
[facility]  Patient:    Danielle Graham, Danielle Graham                         MRN: [id number] Proc. Date: 12/26/00 Adm. Date:  [id number] Attending:  Tonye Royalty                           Operative Report  INDICATIONS:                  A 45 year old, gravida 2, para 1, at 11-1/[redacted] weeks gestation, who presented to the office today concerned about having some vaginal bleeding for the past two days and some mild, vague, lower abdominal discomfort.  Fetal heart tones were not appreciated, and she was sent for an ultrasound.  The ultrasound demonstrated intrauterine pregnancy with no evidence of cardiac activity or fetal viability.  Ultrasound date consistent with nine weeks gestation.  PREOPERATIVE DIAGNOSIS:       Missed abortion, first trimester.  POSTOPERATIVE DIAGNOSIS:      Missed abortion, first trimester.  ANESTHESIA:                   Intravenous sedation along with a paracervical block with 2% Xylocaine with 1:100,000 epinephrine (10 cc).  DESCRIPTION OF PROCEDURE:     After the patient was adequately counseled, she was taken to the operating room where she underwent intravenous sedation.  See anesthesia note.  The patient was placed in a low lithotomy position.  The bladder was evacuated of its contents with a red rubber Roxan Hockey.  Following this, a Graves speculum was inserted to the vaginal vault.  The cervix was cleansed with Betadine solution.  The anterior cervical lip was grasped with a single-tooth tenaculum after 2% Xylocaine was infiltrated into the cervical stroma to the 2, 4, 8, and 10 oclock positions.  Pratt dilator was utilized in an effort to allow sufficient dilatation to be able to introduce a 10 mm suction curette.  This was accomplished.  Maximum pressure on the suction machine was 60 mmHg.  The products of conception were removed.  The suction curette was interchanged with a blunt curette, and the remaining products  of conception were removed.  The single-tooth tenaculum was removed.  The patient did receive 1 gram of Cefotan for prophylaxis, and she was given 30 mg of Toradol en route to the recovery room.  The patients Rh status was positive. IV fluids were 1300 cc of Lactated Ringers. DD:  12/26/00 TD:  12/27/00 Job: [id number]

## 2011-03-18 NOTE — H&P (Signed)
NAME:  Danielle Graham, Danielle Graham                            ACCOUNT NO.:  [id number]   MEDICAL RECORD NO.:  [id number]                   PATIENT TYPE:  AMB   LOCATION:  [facility]                                 FACILITY:  [facility]   PHYSICIAN:  Juan H. Lily Peer, M.D.             DATE OF BIRTH:  03/02/1966   DATE OF ADMISSION:  DATE OF DISCHARGE:                                HISTORY & PHYSICAL   CHIEF COMPLAINT:  Atypical endocervical cells present on Pap smear,  suspicious for endocervical gland dysplasia and/or adenocarcinoma in situ  (AIS).   HISTORY OF PRESENT ILLNESS:  The patient is a 45 year old gravida 4 para 2  who was seen in the office on December 09, 2003 whereby her annual  gynecological examination had been done.  The cytology report had  demonstrated glandular cell abnormality and the description by the  cytologist was atypical endoglandular cells present which are suspicious for  endocervical gland dysplasia and/or adenocarcinoma in situ, (AIS).  The  patient was to undergo a LEEP cervical conization in the office but due to  the concerns of the margins having some form of thermal effect and occluding  the interpretation of the biopsy, it was decided to cancel the LEEP in the  office and proceed with a cold knife cone along with an ECC and endometrial  biopsy as well in an outpatient setting.  Patient has no prior history of  any abnormal Pap smear's.   PAST MEDICAL HISTORY:   ALLERGIES:  Patient is allergic to THIMEROSAL.   PAST SURGICAL HISTORY:  Patient has had two normal spontaneous vaginal  deliveries, two D and C's,  she has had tonsillectomy and adenoidectomy and  right lumpectomy in January of 2001 for breast fibroadenoma.  She has only  been taking calcium supplementation.  Her husband has had a vasectomy.  Patient during review of her records indicated she was diagnosed with what  appeared to be antiphospholipid syndrome during her pregnancy but she  recently was evaluated  by her Internist in June of 2004 and there was no  evidence of antiphospholipid syndrome.   FAMILY HISTORY:  Aunt with breast cancer.   SOCIAL HISTORY:  Patient smokes approximately 2 cigarettes per week.   LABORATORY DATA:  Patient had a mammogram in February 2004 which was normal.   PHYSICAL EXAMINATION:  VITAL SIGNS:  Patient weighs 126 pounds, height 5  feet 4-1/4 inches tall.  Blood pressure 108/70.  HEENT: Unremarkable.  NECK:  Supple.  Trachea midline.  No carotid bruits.  No thyromegaly.  LUNGS:  Clear to auscultation without any rhonchi or wheezes.  HEART:  Regular rate and rhythm with no murmurs or gallops.  BREAST:  Examination done at time of her annual examination was reportedly  normal.  ABDOMEN:  Soft, nontender without rebound or guarding.  PELVIC:  The Bartholin's, urethral and Skene's glands within normal limits.  Vagina and cervix  with description as outlined above, no gross lesions on  inspection.  Bimanual examination: First degree cystocele, anteverted,  normal size uterus with no palpable adnexal masses.  Rectal examination was  unremarkable.   ASSESSMENT:  Patient is a 45 year old gravida 4, para 2, AB 2 with atypical  endocervical cells present on Pap smear, suspicious for endocervical gland  dysplasia and/or adenocarcinoma in situ (AIS).   PLAN:  Patient is scheduled to undergo a detailed colposcopic evaluation in  an outpatient setting to include endocervical curettings and endometrial  biopsy as well as a cold knife cervical conization.  The patient's husband  has had a vasectomy in the past.  The patient reported her last mammogram  was December 23, 2002.  The risks, benefits, pros and cons of the cold knife  conization were discussed.  There is a risk for bleeding and in the event  that she were to need blood or blood products; she is fully aware of the  potential risks of anaphylactic reactions, hepatitis and acquired  immunodeficiency syndrome.   Also in the event of uncontrollable hemorrhage  she could potentially end up with a hysterectomy which is not an issue for  the patient since her husband has had a vasectomy and she is now 45 years of  age.  Will await the result of the pathology report to make sure she does  not have adenocarcinoma before deciding on a consultation with a GYN  oncologist.  All the above was discussed with the patient.  We will follow  her accordingly.   Patient is scheduled for colposcopy, cervical conization, ECC, cervical cone  biopsy and endometrial biopsy on Thursday, January 15, 2004 at [facility].                                               Juan H. Lily Peer, M.D.    JHF/MEDQ  D:  01/12/2004  T:  01/12/2004  Job:  [id number]

## 2011-03-18 NOTE — H&P (Signed)
[facility]  Patient:    Danielle Graham, Danielle Graham                           MRN: [id number] Adm. Date:  12/26/00 Attending:  Gaetano Hawthorne. Lily Peer, M.D.                         History and Physical  NOTATION:                     This patient is scheduled for surgery today on December 26, 2000, at 5 p.m. at [facility].  CHIEF COMPLAINT:              Vaginal bleeding.  HISTORY OF PRESENT ILLNESS:   This 45 year old gravida 2, para 1, with the last menstrual period on October 09, 2000, with estimated date of confinement July 16, 2001.  The patient currently [redacted] weeks gestation, presented to the office today due to the fact that she had had some vaginal bleeding on and off since yesterday, and some vague lower abdominal discomfort.  The fetal heart tones were not appreciated in the office, and there was some brownish-like blood in her vagina, but no active bleeding.  The cervix was not dilated.  She subsequently underwent an ultrasound today which demonstrated a gestational age of eight weeks and one day, in comparison to her last menstrual period which would have placed her at 11 weeks.  There was a single nonviable intrauterine pregnancy in the uterine fundus with a crown to rump length of 15.2 mm, consistent with eight weeks.  No fetal cardiac motion. Normal ovaries were present.  PAST MEDICAL HISTORY:         She has advanced maternal age.  Her husband with a history of HSV.  The patient had a normal spontaneous vaginal delivery in 1996, of a 7 pound 15 ounce female at term.  She has otherwise been in excellent health, with the exception of a slightly elevated high cholesterol at times.  She did have a tonsillectomy at a young age, and benign lumps removed from her breasts.  A wisdom tooth removed at age 6.  ALLERGIES:                    Denied.  PHYSICAL EXAMINATION:  VITAL SIGNS:                  Respirations 18, temperature 99 degrees,  pulse 82, blood pressure 100/60.  HEENT:                        Unremarkable.  NECK:                         Supple, trachea midline.  No carotid bruits. No thyromegaly.  LUNGS:                        Clear to auscultation without rhonchi or wheezes.  HEART:                        A regular rate and rhythm.  No murmurs or gallops.  BREASTS:                      Examination done during the first trimester and reported to  be normal.  ABDOMEN:                      Soft, nontender, without rebound or guarding.  PELVIC:                       Bartholin, urethral, and Skenes glands within normal limits.  Vaginal with brownish-like discharge.  No active bleeding. Cervix is closed.  Uterus is six to eight weeks size.  Adnexa:  No palpable masses or tenderness.  RECTAL:                       Deferred.  PRENATAL LABORATORY DATA:     A-positive blood type.  Negative antibody screen.  VDRL nonreactive.  Rubella immune.  Hepatitis-B and surface antigen and HIV were negative.  Rubella titer with evidence of immunity.  Pap smear has been normal.  ASSESSMENT:                   A 45 year old gravida 2, para 1, at 11-1/2                               weeks by last menstrual period, and eight                               weeks by ultrasound, with vaginal bleeding,                               with evidence of first trimester missed                               abortion, a single intrauterine pregnancy with                               no cardiac activity noted.  PLAN:                         The patient was counseled prior to scheduling this afternoon a dilatation and evacuation.  The risks, including infection, bleeding, trauma to internal organs, perforation of the uterus, and hemorrhage were discussed.  All questions were answered.  The patient feels comfortable proceeding with the procedure.  She last ate breakfast this morning, and has not eaten or drunk anything since that time.  All  questions were answered. Will follow accordingly.  The patient is scheduled for a D&E on Tuesday, December 26, 2000, at 5 p.m. at [facility]. DD:  12/26/00 TD:  12/26/00 Job: [id number]

## 2011-09-28 ENCOUNTER — Ambulatory Visit (INDEPENDENT_AMBULATORY_CARE_PROVIDER_SITE_OTHER): Payer: BC Managed Care – PPO | Admitting: Gynecology

## 2011-09-28 ENCOUNTER — Other Ambulatory Visit (HOSPITAL_COMMUNITY)
Admission: RE | Admit: 2011-09-28 | Discharge: 2011-09-28 | Disposition: A | Discharge status: Home / Self Care | Payer: BC Managed Care – PPO | Source: Ambulatory Visit | Attending: Gynecology | Admitting: Gynecology

## 2011-09-28 ENCOUNTER — Encounter: Payer: Self-pay | Admitting: Gynecology

## 2011-09-28 DIAGNOSIS — Z01419 Encounter for gynecological examination (general) (routine) without abnormal findings: Secondary | ICD-10-CM

## 2011-09-28 DIAGNOSIS — Z1211 Encounter for screening for malignant neoplasm of colon: Secondary | ICD-10-CM

## 2011-09-28 DIAGNOSIS — N393 Stress incontinence (female) (male): Secondary | ICD-10-CM

## 2011-09-28 NOTE — Progress Notes (Signed)
Danielle Graham July 29, 1966 [id number]   History:    45 y.o.  for annual exam with the only complaint of urinary incontinence. She states that when she coughs or sneeze her time she leaks with minimum effort and she wears a pad all the time. She does have urinary frequency and gets up in the middle the night wants to urinate. Review of patient's records indicated back in 2006 she had been referred to Dr. Ihor Gully at [facility] urology for possible consideration of suburethral sling. She has hesitated. Had also had recommended that she obtain a voiding diary for 30 days and she is not done so. Also we have given her instructions on Kegel exercises. Patient's primary physician is Dr.Panosh who has done all patient's lab work in April of this year.  Past medical history,surgical history, family history and social history were all reviewed and documented in the EPIC chart.  Gynecologic History Patient's last menstrual period was 09/12/2011. Contraception: vasectomy Last Pap: 2011. Results were: normal Last mammogram: 2012. Results were: normal  Obstetric History OB History    Grav Para Term Preterm Abortions TAB SAB Ect Mult Living   4 2 2  2  2   2      # Outc Date GA Lbr Len/2nd Wgt Sex Del Anes PTL Lv   1 TRM     F SVD  No Yes   2 TRM     M SVD  No Yes   3 SAB            4 SAB                ROS:  Was performed and pertinent positives and negatives are included in the history.  Exam: chaperone present  BP 110/70  Ht 5\' 4"  (1.626 m)  Wt 132 lb (59.875 kg)  BMI 22.66 kg/m2  LMP 09/12/2011  Body mass index is 22.66 kg/(m^2).  General appearance : Well developed well nourished female. No acute distress HEENT: Neck supple, trachea midline, no carotid bruits, no thyroidmegaly Lungs: Clear to auscultation, no rhonchi or wheezes, or rib retractions  Heart: Regular rate and rhythm, no murmurs or gallops Breast:Examined in sitting and supine position were symmetrical in appearance, no  palpable masses or tenderness,  no skin retraction, no nipple inversion, no nipple discharge, no skin discoloration, no axillary or supraclavicular lymphadenopathy Abdomen: no palpable masses or tenderness, no rebound or guarding Extremities: no edema or skin discoloration or tenderness  Pelvic:  Bartholin, Urethra, Skene Glands: Within normal limits             Vagina: No gross lesions or discharge first degree cystocele  Cervix: No gross lesions or discharge  Uterus  anteverted, normal size, shape and consistency, non-tender and mobile  Adnexa  Without masses or tenderness  Anus and perineum  normal   Rectovaginal  normal sphincter tone without palpated masses or tenderness             Hemoccult patient will be given cards to submit to the office when she returned to the urodynamic testing.     Assessment/Plan:  45 y.o. female for annual exam with complaint of stress urinary incontinence frequency and some nocturia. Patient with first degree cystocele. Q-tip angle tests 30. Patient was examined supine and erect position and on Valsalva maneuver did not leak. We will schedule the next couple weeks per patient have a full urodynamic evaluation here in the office. Her Pap smear was done today. Literature formation  of stress urinary incontinence was provided as well. She was encouraged to do her monthly self breast examination.    Ok Edwards MD, 4:55 PM 09/28/2011

## 2012-04-06 ENCOUNTER — Encounter: Payer: Self-pay | Admitting: Gynecology

## 2012-11-13 ENCOUNTER — Other Ambulatory Visit (INDEPENDENT_AMBULATORY_CARE_PROVIDER_SITE_OTHER): Payer: BC Managed Care – PPO

## 2012-11-13 DIAGNOSIS — Z0000 Encounter for general adult medical examination without abnormal findings: Secondary | ICD-10-CM

## 2012-11-13 LAB — HEPATIC FUNCTION PANEL
ALT: 15 U/L (ref 0–35)
AST: 21 U/L (ref 0–37)
Albumin: 3.9 g/dL (ref 3.5–5.2)
Alkaline Phosphatase: 43 U/L (ref 39–117)
Total Protein: 6.7 g/dL (ref 6.0–8.3)

## 2012-11-13 LAB — BASIC METABOLIC PANEL
CO2: 26 mEq/L (ref 19–32)
Calcium: 8.8 mg/dL (ref 8.4–10.5)
GFR: 77.68 mL/min (ref >60.00)
Glucose, Bld: 92 mg/dL (ref 70–99)
Potassium: 4 mEq/L (ref 3.5–5.1)
Sodium: 140 mEq/L (ref 135–145)

## 2012-11-13 LAB — CBC WITH DIFFERENTIAL/PLATELET
Basophils Relative: 1 % (ref 0.0–3.0)
HCT: 39 % (ref 36.0–46.0)
Hemoglobin: 13 g/dL (ref 12.0–15.0)
Lymphocytes Relative: 27.6 % (ref 12.0–46.0)
Lymphs Abs: 1.5 10*3/uL (ref 0.7–4.0)
Monocytes Relative: 10.6 % (ref 3.0–12.0)
Neutro Abs: 2.9 10*3/uL (ref 1.4–7.7)
RBC: 4.28 Mil/uL (ref 3.87–5.11)

## 2012-11-13 LAB — TSH: TSH: 1.56 u[IU]/mL (ref 0.35–5.50)

## 2012-11-13 LAB — LDL CHOLESTEROL, DIRECT: Direct LDL: 148.9 mg/dL

## 2012-11-28 ENCOUNTER — Encounter: Payer: Self-pay | Admitting: Internal Medicine

## 2012-11-28 ENCOUNTER — Ambulatory Visit (INDEPENDENT_AMBULATORY_CARE_PROVIDER_SITE_OTHER): Payer: BC Managed Care – PPO | Admitting: Internal Medicine

## 2012-11-28 VITALS — BP 104/64 | HR 85 | Temp 97.9°F | Ht 64.25 in | Wt 142.0 lb

## 2012-11-28 DIAGNOSIS — Z0000 Encounter for general adult medical examination without abnormal findings: Secondary | ICD-10-CM

## 2012-11-28 DIAGNOSIS — R32 Unspecified urinary incontinence: Secondary | ICD-10-CM

## 2012-11-28 DIAGNOSIS — R5383 Other fatigue: Secondary | ICD-10-CM

## 2012-11-28 DIAGNOSIS — E785 Hyperlipidemia, unspecified: Secondary | ICD-10-CM

## 2012-11-28 DIAGNOSIS — Z800 Family history of malignant neoplasm of digestive organs: Secondary | ICD-10-CM

## 2012-11-28 DIAGNOSIS — R5381 Other malaise: Secondary | ICD-10-CM

## 2012-11-28 NOTE — Patient Instructions (Addendum)
Continue lifestyle intervention healthy eating and exercise . ADD EXERCISE  Aerobic and resistance exercises as tolerated . Increase sleep some.  Preventive Care for Adults, Female A healthy lifestyle and preventive care can promote health and wellness. Preventive health guidelines for women include the following key practices.  A routine yearly physical is a good way to check with your caregiver about your health and preventive screening. It is a chance to share any concerns and updates on your health, and to receive a thorough exam.  Visit your dentist for a routine exam and preventive care every 6 months. Brush your teeth twice a day and floss once a day. Good oral hygiene prevents tooth decay and gum disease.  The frequency of eye exams is based on your age, health, family medical history, use of contact lenses, and other factors. Follow your caregiver's recommendations for frequency of eye exams.  Eat a healthy diet. Foods like vegetables, fruits, whole grains, low-fat dairy products, and lean protein foods contain the nutrients you need without too many calories. Decrease your intake of foods high in solid fats, added sugars, and salt. Eat the right amount of calories for you.Get information about a proper diet from your caregiver, if necessary.  Regular physical exercise is one of the most important things you can do for your health. Most adults should get at least 150 minutes of moderate-intensity exercise (any activity that increases your heart rate and causes you to sweat) each week. In addition, most adults need muscle-strengthening exercises on 2 or more days a week.  Maintain a healthy weight. The body mass index (BMI) is a screening tool to identify possible weight problems. It provides an estimate of body fat based on height and weight. Your caregiver can help determine your BMI, and can help you achieve or maintain a healthy weight.For adults 20 years and older:  A BMI below 18.5  is considered underweight.  A BMI of 18.5 to 24.9 is normal.  A BMI of 25 to 29.9 is considered overweight.  A BMI of 30 and above is considered obese.  Maintain normal blood lipids and cholesterol levels by exercising and minimizing your intake of saturated fat. Eat a balanced diet with plenty of fruit and vegetables. Blood tests for lipids and cholesterol should begin at age 23 and be repeated every 5 years. If your lipid or cholesterol levels are high, you are over 50, or you are at high risk for heart disease, you may need your cholesterol levels checked more frequently.Ongoing high lipid and cholesterol levels should be treated with medicines if diet and exercise are not effective.  If you smoke, find out from your caregiver how to quit. If you do not use tobacco, do not start.  If you are pregnant, do not drink alcohol. If you are breastfeeding, be very cautious about drinking alcohol. If you are not pregnant and choose to drink alcohol, do not exceed 1 drink per day. One drink is considered to be 12 ounces (355 mL) of beer, 5 ounces (148 mL) of wine, or 1.5 ounces (44 mL) of liquor.  Avoid use of street drugs. Do not share needles with anyone. Ask for help if you need support or instructions about stopping the use of drugs.  High blood pressure causes heart disease and increases the risk of stroke. Your blood pressure should be checked at least every 1 to 2 years. Ongoing high blood pressure should be treated with medicines if weight loss and exercise are not effective.  If you are 52 to 49 years old, ask your caregiver if you should take aspirin to prevent strokes.  Diabetes screening involves taking a blood sample to check your fasting blood sugar level. This should be done once every 3 years, after age 49, if you are within normal weight and without risk factors for diabetes. Testing should be considered at a younger age or be carried out more frequently if you are overweight and have  at least 1 risk factor for diabetes.  Breast cancer screening is essential preventive care for women. You should practice "breast self-awareness." This means understanding the normal appearance and feel of your breasts and may include breast self-examination. Any changes detected, no matter how small, should be reported to a caregiver. Women in their 90s and 30s should have a clinical breast exam (CBE) by a caregiver as part of a regular health exam every 1 to 3 years. After age 27, women should have a CBE every year. Starting at age 41, women should consider having a mammography (breast X-ray test) every year. Women who have a family history of breast cancer should talk to their caregiver about genetic screening. Women at a high risk of breast cancer should talk to their caregivers about having magnetic resonance imaging (MRI) and a mammography every year.  The Pap test is a screening test for cervical cancer. A Pap test can show cell changes on the cervix that might become cervical cancer if left untreated. A Pap test is a procedure in which cells are obtained and examined from the lower end of the uterus (cervix).  Women should have a Pap test starting at age 84.  Between ages 14 and 83, Pap tests should be repeated every 2 years.  Beginning at age 64, you should have a Pap test every 3 years as long as the past 3 Pap tests have been normal.  Some women have medical problems that increase the chance of getting cervical cancer. Talk to your caregiver about these problems. It is especially important to talk to your caregiver if a new problem develops soon after your last Pap test. In these cases, your caregiver may recommend more frequent screening and Pap tests.  The above recommendations are the same for women who have or have not gotten the vaccine for human papillomavirus (HPV).  If you had a hysterectomy for a problem that was not cancer or a condition that could lead to cancer, then you no  longer need Pap tests. Even if you no longer need a Pap test, a regular exam is a good idea to make sure no other problems are starting.  If you are between ages 5 and 53, and you have had normal Pap tests going back 10 years, you no longer need Pap tests. Even if you no longer need a Pap test, a regular exam is a good idea to make sure no other problems are starting.  If you have had past treatment for cervical cancer or a condition that could lead to cancer, you need Pap tests and screening for cancer for at least 20 years after your treatment.  If Pap tests have been discontinued, risk factors (such as a new sexual partner) need to be reassessed to determine if screening should be resumed.  The HPV test is an additional test that may be used for cervical cancer screening. The HPV test looks for the virus that can cause the cell changes on the cervix. The cells collected during the Pap test  can be tested for HPV. The HPV test could be used to screen women aged 58 years and older, and should be used in women of any age who have unclear Pap test results. After the age of 53, women should have HPV testing at the same frequency as a Pap test.  Colorectal cancer can be detected and often prevented. Most routine colorectal cancer screening begins at the age of 81 and continues through age 81. However, your caregiver may recommend screening at an earlier age if you have risk factors for colon cancer. On a yearly basis, your caregiver may provide home test kits to check for hidden blood in the stool. Use of a small camera at the end of a tube, to directly examine the colon (sigmoidoscopy or colonoscopy), can detect the earliest forms of colorectal cancer. Talk to your caregiver about this at age 34, when routine screening begins. Direct examination of the colon should be repeated every 5 to 10 years through age 10, unless early forms of pre-cancerous polyps or small growths are found.  Hepatitis C blood  testing is recommended for all people born from [birth year] through 1967 and any individual with known risks for hepatitis C.  Practice safe sex. Use condoms and avoid high-risk sexual practices to reduce the spread of sexually transmitted infections (STIs). STIs include gonorrhea, chlamydia, syphilis, trichomonas, herpes, HPV, and human immunodeficiency virus (HIV). Herpes, HIV, and HPV are viral illnesses that have no cure. They can result in disability, cancer, and death. Sexually active women aged 41 and younger should be checked for chlamydia. Older women with new or multiple partners should also be tested for chlamydia. Testing for other STIs is recommended if you are sexually active and at increased risk.  Osteoporosis is a disease in which the bones lose minerals and strength with aging. This can result in serious bone fractures. The risk of osteoporosis can be identified using a bone density scan. Women ages 14 and over and women at risk for fractures or osteoporosis should discuss screening with their caregivers. Ask your caregiver whether you should take a calcium supplement or vitamin D to reduce the rate of osteoporosis.  Menopause can be associated with physical symptoms and risks. Hormone replacement therapy is available to decrease symptoms and risks. You should talk to your caregiver about whether hormone replacement therapy is right for you.  Use sunscreen with sun protection factor (SPF) of 30 or more. Apply sunscreen liberally and repeatedly throughout the day. You should seek shade when your shadow is shorter than you. Protect yourself by wearing long sleeves, pants, a wide-brimmed hat, and sunglasses year round, whenever you are outdoors.  Once a month, do a whole body skin exam, using a mirror to look at the skin on your back. Notify your caregiver of new moles, moles that have irregular borders, moles that are larger than a pencil eraser, or moles that have changed in shape or  color.  Stay current with required immunizations.  Influenza. You need a dose every fall (or winter). The composition of the flu vaccine changes each year, so being vaccinated once is not enough.  Pneumococcal polysaccharide. You need 1 to 2 doses if you smoke cigarettes or if you have certain chronic medical conditions. You need 1 dose at age 66 (or older) if you have never been vaccinated.  Tetanus, diphtheria, pertussis (Tdap, Td). Get 1 dose of Tdap vaccine if you are younger than age 56, are over 21 and have contact with an infant,  are a Research scientist (physical sciences), are pregnant, or simply want to be protected from whooping cough. After that, you need a Td booster dose every 10 years. Consult your caregiver if you have not had at least 3 tetanus and diphtheria-containing shots sometime in your life or have a deep or dirty wound.  HPV. You need this vaccine if you are a woman age 68 or younger. The vaccine is given in 3 doses over 6 months.  Measles, mumps, rubella (MMR). You need at least 1 dose of MMR if you were born in 1957 or later. You may also need a second dose.  Meningococcal. If you are age 85 to 2 and a first-year college student living in a residence hall, or have one of several medical conditions, you need to get vaccinated against meningococcal disease. You may also need additional booster doses.  Zoster (shingles). If you are age 83 or older, you should get this vaccine.  Varicella (chickenpox). If you have never had chickenpox or you were vaccinated but received only 1 dose, talk to your caregiver to find out if you need this vaccine.  Hepatitis A. You need this vaccine if you have a specific risk factor for hepatitis A virus infection or you simply wish to be protected from this disease. The vaccine is usually given as 2 doses, 6 to 18 months apart.  Hepatitis B. You need this vaccine if you have a specific risk factor for hepatitis B virus infection or you simply wish to be  protected from this disease. The vaccine is given in 3 doses, usually over 6 months. Preventive Services / Frequency Ages 37 to 79  Blood pressure check.** / Every 1 to 2 years.  Lipid and cholesterol check.** / Every 5 years beginning at age 87.  Clinical breast exam.** / Every 3 years for women in their 61s and 30s.  Pap test.** / Every 2 years from ages 74 through 42. Every 3 years starting at age 26 through age 73 or 82 with a history of 3 consecutive normal Pap tests.  HPV screening.** / Every 3 years from ages 7 through ages 88 to 40 with a history of 3 consecutive normal Pap tests.  Hepatitis C blood test.** / For any individual with known risks for hepatitis C.  Skin self-exam. / Monthly.  Influenza immunization.** / Every year.  Pneumococcal polysaccharide immunization.** / 1 to 2 doses if you smoke cigarettes or if you have certain chronic medical conditions.  Tetanus, diphtheria, pertussis (Tdap, Td) immunization. / A one-time dose of Tdap vaccine. After that, you need a Td booster dose every 10 years.  HPV immunization. / 3 doses over 6 months, if you are 38 and younger.  Measles, mumps, rubella (MMR) immunization. / You need at least 1 dose of MMR if you were born in 1957 or later. You may also need a second dose.  Meningococcal immunization. / 1 dose if you are age 23 to 52 and a first-year college student living in a residence hall, or have one of several medical conditions, you need to get vaccinated against meningococcal disease. You may also need additional booster doses.  Varicella immunization.** / Consult your caregiver.  Hepatitis A immunization.** / Consult your caregiver. 2 doses, 6 to 18 months apart.  Hepatitis B immunization.** / Consult your caregiver. 3 doses usually over 6 months. Ages 76 to 39  Blood pressure check.** / Every 1 to 2 years.  Lipid and cholesterol check.** / Every 5 years beginning at  age 19.  Clinical breast exam.** / Every year  after age 79.  Mammogram.** / Every year beginning at age 2 and continuing for as long as you are in good health. Consult with your caregiver.  Pap test.** / Every 3 years starting at age 47 through age 17 or 23 with a history of 3 consecutive normal Pap tests.  HPV screening.** / Every 3 years from ages 63 through ages 19 to 31 with a history of 3 consecutive normal Pap tests.  Fecal occult blood test (FOBT) of stool. / Every year beginning at age 77 and continuing until age 69. You may not need to do this test if you get a colonoscopy every 10 years.  Flexible sigmoidoscopy or colonoscopy.** / Every 5 years for a flexible sigmoidoscopy or every 10 years for a colonoscopy beginning at age 37 and continuing until age 16.  Hepatitis C blood test.** / For all people born from [birth year] through 1967 and any individual with known risks for hepatitis C.  Skin self-exam. / Monthly.  Influenza immunization.** / Every year.  Pneumococcal polysaccharide immunization.** / 1 to 2 doses if you smoke cigarettes or if you have certain chronic medical conditions.  Tetanus, diphtheria, pertussis (Tdap, Td) immunization.** / A one-time dose of Tdap vaccine. After that, you need a Td booster dose every 10 years.  Measles, mumps, rubella (MMR) immunization. / You need at least 1 dose of MMR if you were born in 1957 or later. You may also need a second dose.  Varicella immunization.** / Consult your caregiver.  Meningococcal immunization.** / Consult your caregiver.  Hepatitis A immunization.** / Consult your caregiver. 2 doses, 6 to 18 months apart.  Hepatitis B immunization.** / Consult your caregiver. 3 doses, usually over 6 months.  Exercise to Stay Healthy Exercise helps you become and stay healthy. EXERCISE IDEAS AND TIPS Choose exercises that:  You enjoy.  Fit into your day. You do not need to exercise really hard to be healthy. You can do exercises at a slow or medium level and stay healthy.  You can:  Stretch before and after working out.  Try yoga, Pilates, or tai chi.  Lift weights.  Walk fast, swim, jog, run, climb stairs, bicycle, dance, or rollerskate.  Take aerobic classes. Exercises that burn about 150 calories:  Running 1  miles in 15 minutes.  Playing volleyball for 45 to 60 minutes.  Washing and waxing a car for 45 to 60 minutes.  Playing touch football for 45 minutes.  Walking 1  miles in 35 minutes.  Pushing a stroller 1  miles in 30 minutes.  Playing basketball for 30 minutes.  Raking leaves for 30 minutes.  Bicycling 5 miles in 30 minutes.  Walking 2 miles in 30 minutes.  Dancing for 30 minutes.  Shoveling snow for 15 minutes.  Swimming laps for 20 minutes.  Walking up stairs for 15 minutes.  Bicycling 4 miles in 15 minutes.  Gardening for 30 to 45 minutes.  Jumping rope for 15 minutes.  Washing windows or floors for 45 to 60 minutes. Document Released: 11/19/2010 Document Revised: 01/09/2012 Document Reviewed: 11/19/2010 [facility] Patient Information 2013 [place], [place].

## 2012-11-28 NOTE — Progress Notes (Signed)
Chief Complaint  Patient presents with  . Annual Exam    HPI: Patient comes in today for Preventive Health Care visit  No reg exrecise  Some dec flexibly.  . is left-handed and has noted some discomfort intermittently in the left shoulder blade area without associated symptoms. Noted more after driving in the car on her trip to look at colleges with her daughter. No weakness numbness in ciders medicator's. Also no injury no regular exercise at this time sits a lot at work. 7 hours of sleep reports lack of energy a good bit doesn't feel depressed or anxious but sometimes overwhelmed with the amount of things that she needs to accomplish.  Sees dermatologist and eye doctor yearly. Still gets UI when runs or exercises. When swallowing french fries and interrupts sometimes feels like she can't get it down but no esophageal pain reflux or choking. ROS:  GEN/ HEENT: No fever, significant weight changes sweats headaches vision problems glasses.   hearing changes, CV/ PULM; No chest pain shortness of breath cough, syncope,edema  change in exercise tolerance. GI /GU: No adominal pain, vomiting, change in bowel habits. No blood in the stool. No significant GU symptoms.  UI  Didn't fu for UI . Planning  Consider sling.  SKIN/HEME: ,no acute skin rashes suspicious lesions or bleeding. No lymphadenopathy, nodules, masses.  NEURO/ PSYCH:  No neurologic signs such as weakness numbness. No depression anxiety. IMM/ Allergy: No unusual infections.  Allergy .   REST of 12 system review negative except as per HPI   Past Medical History  Diagnosis Date  . Hyperlipidemia   . Allergic rhinitis, seasonal   . Urinary incontinence   . History of colonic polyps     pre cancer  . History of chicken pox   . HSV (herpes simplex virus) infection   . NSVD (normal spontaneous vaginal delivery)     x2  . HERPES GENITALIS 11/06/2009    Qualifier: Diagnosis of  By: Lawernce Ion, CMA (AAMA), Bethann Berkshire   . HYPERGLYCEMIA,  FASTING 03/22/2010    Qualifier: Diagnosis of  By: Fabian Sharp MD, Neta Mends     Family History  Problem Relation Age of Onset  . Asthma Mother   . Colon cancer Father     deceased  . Cancer Father 68    colon  . Heart disease Paternal Grandmother     60 s  . Other      ELEVated WBC count in half sister  . Breast cancer Maternal Aunt     History   Social History  . Marital Status: Married    Spouse Name: N/A    Number of Children: N/A  . Years of Education: N/A   Social History Main Topics  . Smoking status: Former Smoker    Quit date: 09/27/2008  . Smokeless tobacco: Never Used  . Alcohol Use: Yes     Comment: weekend only...3 drinks  . Drug Use: No  . Sexually Active: Yes -- Female partner(s)     Comment: PARTNER WITH VASECTOMY   Other Topics Concern  . None   Social History Narrative   MarriedHH of 42 children and husband1 dogOccupation: Emergency planning/management officer [employer] bs degree 7 hours  New job recentlyNo tobacc  2 caffien  Few drinks weekend .  Has smoke detector and wears seat belts.  No firearms stored safely. . No excess sun exposure. Sees dentist regularly . No [id number]    Outpatient Encounter Prescriptions as of 11/28/2012  Medication Sig  Dispense Refill  . aspirin 81 MG tablet Take 81 mg by mouth daily.        . calcium carbonate (OS-CAL) 1250 MG chewable tablet Chew 1 tablet by mouth daily.        . fish oil-omega-3 fatty acids 1000 MG capsule Take 2 g by mouth daily.        . MULTIPLE VITAMIN PO Take by mouth.        . [DISCONTINUED] TDaP (ADACEL) injection 0.5 mL         EXAM:  BP 104/64  Pulse 85  Temp 97.9 F (36.6 C) (Oral)  Ht 5' 4.25" (1.632 m)  Wt 142 lb (64.411 kg)  BMI 24.19 kg/m2  SpO2 99%  LMP 11/13/2012  Body mass index is 24.19 kg/(m^2). Wt Readings from Last 3 Encounters:  11/28/12 142 lb (64.411 kg)  09/28/11 132 lb (59.875 kg)  03/01/11 138 lb (62.596 kg)    Physical Exam: Vital signs reviewed JXB:JYNW is a well-developed  well-nourished alert cooperative   female who appears her stated age in no acute distress.  HEENT: normocephalic atraumatic , Eyes: PERRL EOM's full, conjunctiva clear, Nares: paten,t no deformity discharge or tenderness., Ears: no deformity EAC's clear TMs with normal landmarks. Mouth: clear OP, no lesions, edema.  Moist mucous membranes. Dentition in adequate repair. NECK: supple without masses, thyromegaly or bruits. CHEST/PULM:  Clear to auscultation and percussion breath sounds equal no wheeze , rales or rhonchi. No chest wall deformities or tenderness. Breast: normal by inspection . No dimpling, discharge, masses, tenderness or discharge . CV: PMI is nondisplaced, S1 S2 no gallops, murmurs, rubs. Peripheral pulses are full without delay.No JVD .  ABDOMEN: Bowel sounds normal nontender  No guard or rebound, no hepato splenomegal no CVA tenderness.  No hernia. Extremtities:  No clubbing cyanosis or edema, no acute joint swelling or redness no focal atrophy NEURO:  Oriented x3, cranial nerves 3-12 appear to be intact, no obvious focal weakness,gait within normal limits no abnormal reflexes or asymmetrical SKIN: No acute rashes normal turgor, color, no bruising or petechiae. Sun changes  PSYCH: Oriented, good eye contact, no obvious depression anxiety, cognition and judgment appear normal. LN: no cervical axillary inguinal adenopathy  Lab Results  Component Value Date   WBC 5.3 11/13/2012   HGB 13.0 11/13/2012   HCT 39.0 11/13/2012   PLT 306.0 11/13/2012   GLUCOSE 92 11/13/2012   CHOL 238* 11/13/2012   TRIG 59.0 11/13/2012   HDL 71.30 11/13/2012   LDLDIRECT 148.9 11/13/2012   ALT 15 11/13/2012   AST 21 11/13/2012   NA 140 11/13/2012   K 4.0 11/13/2012   CL 107 11/13/2012   CREATININE 0.8 11/13/2012   BUN 14 11/13/2012   CO2 26 11/13/2012   TSH 1.56 11/13/2012    ASSESSMENT AND PLAN:  Discussed the following assessment and plan:  1. Visit for preventive health examination    utd  avoid further  weight gain bmi is normal at present reviewed menopausal age changes and tendency to ge weight gain.  2. URINARY INCONTINENCE   3. Decreased energy    normal exam and no specific disease suggest lifestyle changes follow up if persistent or progressive  4. HYPERLIPIDEMIA    i lsi  reviewed  ratio is a 3   5. Family history of colon cancer    father ;had colonoscopy 12 10   Disc all of her concerns on list  .  No  Alarm features  Increase sleep  and exercise and fu if needed otherwise one year check lipids etc then .  Patient Care Team: Madelin Headings, MD as PCP - General Ok Edwards, MD (Obstetrics and Gynecology) Janalyn Harder, MD (Dermatology) Patient Instructions  Continue lifestyle intervention healthy eating and exercise . ADD EXERCISE  Aerobic and resistance exercises as tolerated . Increase sleep some.  Preventive Care for Adults, Female A healthy lifestyle and preventive care can promote health and wellness. Preventive health guidelines for women include the following key practices.  A routine yearly physical is a good way to check with your caregiver about your health and preventive screening. It is a chance to share any concerns and updates on your health, and to receive a thorough exam.  Visit your dentist for a routine exam and preventive care every 6 months. Brush your teeth twice a day and floss once a day. Good oral hygiene prevents tooth decay and gum disease.  The frequency of eye exams is based on your age, health, family medical history, use of contact lenses, and other factors. Follow your caregiver's recommendations for frequency of eye exams.  Eat a healthy diet. Foods like vegetables, fruits, whole grains, low-fat dairy products, and lean protein foods contain the nutrients you need without too many calories. Decrease your intake of foods high in solid fats, added sugars, and salt. Eat the right amount of calories for you.Get information about a proper diet from  your caregiver, if necessary.  Regular physical exercise is one of the most important things you can do for your health. Most adults should get at least 150 minutes of moderate-intensity exercise (any activity that increases your heart rate and causes you to sweat) each week. In addition, most adults need muscle-strengthening exercises on 2 or more days a week.  Maintain a healthy weight. The body mass index (BMI) is a screening tool to identify possible weight problems. It provides an estimate of body fat based on height and weight. Your caregiver can help determine your BMI, and can help you achieve or maintain a healthy weight.For adults 20 years and older:  A BMI below 18.5 is considered underweight.  A BMI of 18.5 to 24.9 is normal.  A BMI of 25 to 29.9 is considered overweight.  A BMI of 30 and above is considered obese.  Maintain normal blood lipids and cholesterol levels by exercising and minimizing your intake of saturated fat. Eat a balanced diet with plenty of fruit and vegetables. Blood tests for lipids and cholesterol should begin at age 42 and be repeated every 5 years. If your lipid or cholesterol levels are high, you are over 50, or you are at high risk for heart disease, you may need your cholesterol levels checked more frequently.Ongoing high lipid and cholesterol levels should be treated with medicines if diet and exercise are not effective.  If you smoke, find out from your caregiver how to quit. If you do not use tobacco, do not start.  If you are pregnant, do not drink alcohol. If you are breastfeeding, be very cautious about drinking alcohol. If you are not pregnant and choose to drink alcohol, do not exceed 1 drink per day. One drink is considered to be 12 ounces (355 mL) of beer, 5 ounces (148 mL) of wine, or 1.5 ounces (44 mL) of liquor.  Avoid use of street drugs. Do not share needles with anyone. Ask for help if you need support or instructions about stopping the use  of drugs.  High  blood pressure causes heart disease and increases the risk of stroke. Your blood pressure should be checked at least every 1 to 2 years. Ongoing high blood pressure should be treated with medicines if weight loss and exercise are not effective.  If you are 49 to 49 years old, ask your caregiver if you should take aspirin to prevent strokes.  Diabetes screening involves taking a blood sample to check your fasting blood sugar level. This should be done once every 3 years, after age 63, if you are within normal weight and without risk factors for diabetes. Testing should be considered at a younger age or be carried out more frequently if you are overweight and have at least 1 risk factor for diabetes.  Breast cancer screening is essential preventive care for women. You should practice "breast self-awareness." This means understanding the normal appearance and feel of your breasts and may include breast self-examination. Any changes detected, no matter how small, should be reported to a caregiver. Women in their 9s and 30s should have a clinical breast exam (CBE) by a caregiver as part of a regular health exam every 1 to 3 years. After age 93, women should have a CBE every year. Starting at age 71, women should consider having a mammography (breast X-ray test) every year. Women who have a family history of breast cancer should talk to their caregiver about genetic screening. Women at a high risk of breast cancer should talk to their caregivers about having magnetic resonance imaging (MRI) and a mammography every year.  The Pap test is a screening test for cervical cancer. A Pap test can show cell changes on the cervix that might become cervical cancer if left untreated. A Pap test is a procedure in which cells are obtained and examined from the lower end of the uterus (cervix).  Women should have a Pap test starting at age 33.  Between ages 28 and 70, Pap tests should be repeated every 2  years.  Beginning at age 35, you should have a Pap test every 3 years as long as the past 3 Pap tests have been normal.  Some women have medical problems that increase the chance of getting cervical cancer. Talk to your caregiver about these problems. It is especially important to talk to your caregiver if a new problem develops soon after your last Pap test. In these cases, your caregiver may recommend more frequent screening and Pap tests.  The above recommendations are the same for women who have or have not gotten the vaccine for human papillomavirus (HPV).  If you had a hysterectomy for a problem that was not cancer or a condition that could lead to cancer, then you no longer need Pap tests. Even if you no longer need a Pap test, a regular exam is a good idea to make sure no other problems are starting.  If you are between ages 84 and 56, and you have had normal Pap tests going back 10 years, you no longer need Pap tests. Even if you no longer need a Pap test, a regular exam is a good idea to make sure no other problems are starting.  If you have had past treatment for cervical cancer or a condition that could lead to cancer, you need Pap tests and screening for cancer for at least 20 years after your treatment.  If Pap tests have been discontinued, risk factors (such as a new sexual partner) need to be reassessed to determine if screening should  be resumed.  The HPV test is an additional test that may be used for cervical cancer screening. The HPV test looks for the virus that can cause the cell changes on the cervix. The cells collected during the Pap test can be tested for HPV. The HPV test could be used to screen women aged 21 years and older, and should be used in women of any age who have unclear Pap test results. After the age of 30, women should have HPV testing at the same frequency as a Pap test.  Colorectal cancer can be detected and often prevented. Most routine colorectal cancer  screening begins at the age of 12 and continues through age 66. However, your caregiver may recommend screening at an earlier age if you have risk factors for colon cancer. On a yearly basis, your caregiver may provide home test kits to check for hidden blood in the stool. Use of a small camera at the end of a tube, to directly examine the colon (sigmoidoscopy or colonoscopy), can detect the earliest forms of colorectal cancer. Talk to your caregiver about this at age 68, when routine screening begins. Direct examination of the colon should be repeated every 5 to 10 years through age 72, unless early forms of pre-cancerous polyps or small growths are found.  Hepatitis C blood testing is recommended for all people born from [birth year] through 1965 and any individual with known risks for hepatitis C.  Practice safe sex. Use condoms and avoid high-risk sexual practices to reduce the spread of sexually transmitted infections (STIs). STIs include gonorrhea, chlamydia, syphilis, trichomonas, herpes, HPV, and human immunodeficiency virus (HIV). Herpes, HIV, and HPV are viral illnesses that have no cure. They can result in disability, cancer, and death. Sexually active women aged 26 and younger should be checked for chlamydia. Older women with new or multiple partners should also be tested for chlamydia. Testing for other STIs is recommended if you are sexually active and at increased risk.  Osteoporosis is a disease in which the bones lose minerals and strength with aging. This can result in serious bone fractures. The risk of osteoporosis can be identified using a bone density scan. Women ages 35 and over and women at risk for fractures or osteoporosis should discuss screening with their caregivers. Ask your caregiver whether you should take a calcium supplement or vitamin D to reduce the rate of osteoporosis.  Menopause can be associated with physical symptoms and risks. Hormone replacement therapy is available to  decrease symptoms and risks. You should talk to your caregiver about whether hormone replacement therapy is right for you.  Use sunscreen with sun protection factor (SPF) of 30 or more. Apply sunscreen liberally and repeatedly throughout the day. You should seek shade when your shadow is shorter than you. Protect yourself by wearing long sleeves, pants, a wide-brimmed hat, and sunglasses year round, whenever you are outdoors.  Once a month, do a whole body skin exam, using a mirror to look at the skin on your back. Notify your caregiver of new moles, moles that have irregular borders, moles that are larger than a pencil eraser, or moles that have changed in shape or color.  Stay current with required immunizations.  Influenza. You need a dose every fall (or winter). The composition of the flu vaccine changes each year, so being vaccinated once is not enough.  Pneumococcal polysaccharide. You need 1 to 2 doses if you smoke cigarettes or if you have certain chronic medical conditions. You need  1 dose at age 31 (or older) if you have never been vaccinated.  Tetanus, diphtheria, pertussis (Tdap, Td). Get 1 dose of Tdap vaccine if you are younger than age 54, are over 8 and have contact with an infant, are a Research scientist (physical sciences), are pregnant, or simply want to be protected from whooping cough. After that, you need a Td booster dose every 10 years. Consult your caregiver if you have not had at least 3 tetanus and diphtheria-containing shots sometime in your life or have a deep or dirty wound.  HPV. You need this vaccine if you are a woman age 50 or younger. The vaccine is given in 3 doses over 6 months.  Measles, mumps, rubella (MMR). You need at least 1 dose of MMR if you were born in 1957 or later. You may also need a second dose.  Meningococcal. If you are age 66 to 75 and a first-year college student living in a residence hall, or have one of several medical conditions, you need to get vaccinated  against meningococcal disease. You may also need additional booster doses.  Zoster (shingles). If you are age 61 or older, you should get this vaccine.  Varicella (chickenpox). If you have never had chickenpox or you were vaccinated but received only 1 dose, talk to your caregiver to find out if you need this vaccine.  Hepatitis A. You need this vaccine if you have a specific risk factor for hepatitis A virus infection or you simply wish to be protected from this disease. The vaccine is usually given as 2 doses, 6 to 18 months apart.  Hepatitis B. You need this vaccine if you have a specific risk factor for hepatitis B virus infection or you simply wish to be protected from this disease. The vaccine is given in 3 doses, usually over 6 months. Preventive Services / Frequency Ages 18 to 16  Blood pressure check.** / Every 1 to 2 years.  Lipid and cholesterol check.** / Every 5 years beginning at age 22.  Clinical breast exam.** / Every 3 years for women in their 70s and 30s.  Pap test.** / Every 2 years from ages 24 through 35. Every 3 years starting at age 60 through age 40 or 59 with a history of 3 consecutive normal Pap tests.  HPV screening.** / Every 3 years from ages 76 through ages 49 to 96 with a history of 3 consecutive normal Pap tests.  Hepatitis C blood test.** / For any individual with known risks for hepatitis C.  Skin self-exam. / Monthly.  Influenza immunization.** / Every year.  Pneumococcal polysaccharide immunization.** / 1 to 2 doses if you smoke cigarettes or if you have certain chronic medical conditions.  Tetanus, diphtheria, pertussis (Tdap, Td) immunization. / A one-time dose of Tdap vaccine. After that, you need a Td booster dose every 10 years.  HPV immunization. / 3 doses over 6 months, if you are 66 and younger.  Measles, mumps, rubella (MMR) immunization. / You need at least 1 dose of MMR if you were born in 1957 or later. You may also need a second  dose.  Meningococcal immunization. / 1 dose if you are age 30 to 74 and a first-year college student living in a residence hall, or have one of several medical conditions, you need to get vaccinated against meningococcal disease. You may also need additional booster doses.  Varicella immunization.** / Consult your caregiver.  Hepatitis A immunization.** / Consult your caregiver. 2 doses, 6 to  18 months apart.  Hepatitis B immunization.** / Consult your caregiver. 3 doses usually over 6 months. Ages 69 to 66  Blood pressure check.** / Every 1 to 2 years.  Lipid and cholesterol check.** / Every 5 years beginning at age 66.  Clinical breast exam.** / Every year after age 74.  Mammogram.** / Every year beginning at age 37 and continuing for as long as you are in good health. Consult with your caregiver.  Pap test.** / Every 3 years starting at age 61 through age 71 or 85 with a history of 3 consecutive normal Pap tests.  HPV screening.** / Every 3 years from ages 15 through ages 59 to 25 with a history of 3 consecutive normal Pap tests.  Fecal occult blood test (FOBT) of stool. / Every year beginning at age 63 and continuing until age 75. You may not need to do this test if you get a colonoscopy every 10 years.  Flexible sigmoidoscopy or colonoscopy.** / Every 5 years for a flexible sigmoidoscopy or every 10 years for a colonoscopy beginning at age 53 and continuing until age 35.  Hepatitis C blood test.** / For all people born from [birth year] through 1965 and any individual with known risks for hepatitis C.  Skin self-exam. / Monthly.  Influenza immunization.** / Every year.  Pneumococcal polysaccharide immunization.** / 1 to 2 doses if you smoke cigarettes or if you have certain chronic medical conditions.  Tetanus, diphtheria, pertussis (Tdap, Td) immunization.** / A one-time dose of Tdap vaccine. After that, you need a Td booster dose every 10 years.  Measles, mumps, rubella (MMR)  immunization. / You need at least 1 dose of MMR if you were born in 1957 or later. You may also need a second dose.  Varicella immunization.** / Consult your caregiver.  Meningococcal immunization.** / Consult your caregiver.  Hepatitis A immunization.** / Consult your caregiver. 2 doses, 6 to 18 months apart.  Hepatitis B immunization.** / Consult your caregiver. 3 doses, usually over 6 months.  Exercise to Stay Healthy Exercise helps you become and stay healthy. EXERCISE IDEAS AND TIPS Choose exercises that:  You enjoy.  Fit into your day. You do not need to exercise really hard to be healthy. You can do exercises at a slow or medium level and stay healthy. You can:  Stretch before and after working out.  Try yoga, Pilates, or tai chi.  Lift weights.  Walk fast, swim, jog, run, climb stairs, bicycle, dance, or rollerskate.  Take aerobic classes. Exercises that burn about 150 calories:  Running 1  miles in 15 minutes.  Playing volleyball for 45 to 60 minutes.  Washing and waxing a car for 45 to 60 minutes.  Playing touch football for 45 minutes.  Walking 1  miles in 35 minutes.  Pushing a stroller 1  miles in 30 minutes.  Playing basketball for 30 minutes.  Raking leaves for 30 minutes.  Bicycling 5 miles in 30 minutes.  Walking 2 miles in 30 minutes.  Dancing for 30 minutes.  Shoveling snow for 15 minutes.  Swimming laps for 20 minutes.  Walking up stairs for 15 minutes.  Bicycling 4 miles in 15 minutes.  Gardening for 30 to 45 minutes.  Jumping rope for 15 minutes.  Washing windows or floors for 45 to 60 minutes. Document Released: 11/19/2010 Document Revised: 01/09/2012 Document Reviewed: 11/19/2010 [facility] Patient Information 2013 [place], [place].     Neta Mends. Christino Mcglinchey M.D. Health Maintenance  Topic Date Due  .  Influenza Vaccine  07/01/2012  . Pap Smear  09/27/2012  . Mammogram  03/30/2013  . Tetanus/tdap  02/28/2021   Health  Maintenance Review

## 2013-04-22 ENCOUNTER — Encounter: Payer: Self-pay | Admitting: Gynecology

## 2013-04-22 ENCOUNTER — Ambulatory Visit (INDEPENDENT_AMBULATORY_CARE_PROVIDER_SITE_OTHER): Payer: BC Managed Care – PPO | Admitting: Gynecology

## 2013-04-22 ENCOUNTER — Other Ambulatory Visit (HOSPITAL_COMMUNITY)
Admission: RE | Admit: 2013-04-22 | Discharge: 2013-04-22 | Disposition: A | Discharge status: Home / Self Care | Payer: BC Managed Care – PPO | Source: Ambulatory Visit | Attending: Gynecology | Admitting: Gynecology

## 2013-04-22 VITALS — BP 120/82 | Ht 63.5 in | Wt 140.0 lb

## 2013-04-22 DIAGNOSIS — Z1151 Encounter for screening for human papillomavirus (HPV): Secondary | ICD-10-CM | POA: Insufficient documentation

## 2013-04-22 DIAGNOSIS — Z01419 Encounter for gynecological examination (general) (routine) without abnormal findings: Secondary | ICD-10-CM | POA: Insufficient documentation

## 2013-04-22 DIAGNOSIS — N393 Stress incontinence (female) (male): Secondary | ICD-10-CM

## 2013-04-22 LAB — HM MAMMOGRAPHY

## 2013-04-22 NOTE — Progress Notes (Addendum)
Danielle Graham [birth year] [id number]   History:    47 y.o.  for annual gyn exam was not been seen since 2012. Patient was complaining of urinary incontinence the same as she did back then. She states that when she coughs or sneeze her time she leaks with minimum effort and she wears a pad all the time. She does have urinary frequency and gets up in the middle the night wants to urinate. Review of patient's records indicated back in 2006 she had been referred to Dr. Ihor Gully at [facility] urology for possible consideration of suburethral sling. She has hesitated. Had also had recommended that she obtain a voiding diary for 30 days and she is not done so. Also we have given her instructions on Kegel exercises. Patient's primary physician is Dr.Panosh who has done all patient's lab work in  January  of this year.  Patient denies any past history of abnormal Pap smears. Her husband has had a vasectomy. She is having normal menstrual cycles. She had her Tdap vaccine in 2008.  Review of her record indicated that in 2005 she had AGUS (atypical glandular cells of undetermined significance) and had a negative cervical conization and subsequent Pap smears have all been negative. She had her mammogram early today results pending at time of this dictation.   Past medical history,surgical history, family history and social history were all reviewed and documented in the EPIC chart.  Gynecologic History Patient's last menstrual period was 04/11/2013. Contraception: vasectomy Last Pap: 2012. Results were: normal Last mammogram: 2014. Results were: done today results pending  Obstetric History OB History   Grav Para Term Preterm Abortions TAB SAB Ect Mult Living   4 2 2  2  2   2      # Outc Date GA Lbr Len/2nd Wgt Sex Del Anes PTL Lv   1 TRM     F SVD  No Yes   2 TRM     M SVD  No Yes   3 SAB            4 SAB                ROS: A ROS was performed and pertinent positives and negatives are included in  the history.  GENERAL: No fevers or chills. HEENT: No change in vision, no earache, sore throat or sinus congestion. NECK: No pain or stiffness. CARDIOVASCULAR: No chest pain or pressure. No palpitations. PULMONARY: No shortness of breath, cough or wheeze. GASTROINTESTINAL: No abdominal pain, nausea, vomiting or diarrhea, melena or bright red blood per rectum. GENITOURINARY: No urinary frequency, urgency, hesitancy or dysuria. MUSCULOSKELETAL: No joint or muscle pain, no back pain, no recent trauma. DERMATOLOGIC: No rash, no itching, no lesions. ENDOCRINE: No polyuria, polydipsia, no heat or cold intolerance. No recent change in weight. HEMATOLOGICAL: No anemia or easy bruising or bleeding. NEUROLOGIC: No headache, seizures, numbness, tingling or weakness. PSYCHIATRIC: No depression, no loss of interest in normal activity or change in sleep pattern.     Exam: chaperone present  BP 120/82  Ht 5' 3.5" (1.613 m)  Wt 140 lb (63.504 kg)  BMI 24.41 kg/m2  LMP 04/11/2013  Body mass index is 24.41 kg/(m^2).  General appearance : Well developed well nourished female. No acute distress HEENT: Neck supple, trachea midline, no carotid bruits, no thyroidmegaly Lungs: Clear to auscultation, no rhonchi or wheezes, or rib retractions  Heart: Regular rate and rhythm, no murmurs or gallops Breast:Examined in sitting and supine  position were symmetrical in appearance, no palpable masses or tenderness,  no skin retraction, no nipple inversion, no nipple discharge, no skin discoloration, no axillary or supraclavicular lymphadenopathy Abdomen: no palpable masses or tenderness, no rebound or guarding Extremities: no edema or skin discoloration or tenderness  Pelvic:  Bartholin, Urethra, Skene Glands: Within normal limits             Vagina: No gross lesions or discharge  Cervix: No gross lesions or discharge  Uterus  anteverted, normal size, shape and consistency, non-tender and mobile  Adnexa  Without masses  or tenderness  Anus and perineum  normal   Rectovaginal  normal sphincter tone without palpated masses or tenderness             Hemoccult none indicated   Patient was examined in the supine and erect position. On Valsalva maneuver she did not leak urine. Q-tip angle test right at 30. Patient does have a first-degree cystocele.  Assessment/Plan:  47 y.o. female for annual exam with occasional nocturia and stress urinary incontinence. The patient states it has gotten worse in 2012. We cannot demonstrate leakage of urine here in the office. She states that she will followup with a recommended urodynamic evaluation here in our office and determine treatment plan such as medical versus sling procedure versus urology referral. Her Pap smear was done today. No lab work done today her primary physician did it in January this year and is following her for hyperlipidemia. We discussed importance of calcium vitamin D and regular exercise for osteoporosis prevention. She will need a colonoscopy next year.    Ok Edwards MD, 5:19 PM 04/22/2013

## 2013-04-22 NOTE — Patient Instructions (Addendum)
Urodynamic Testing       If you have a problem with urine leakage, urodynamic testing may be useful. This non-invasive test will be helpful to find out the cause of the problem.   Problems in the urinary system can be caused by aging, illness, or injury. The muscles in your ureters, bladder, and urethra can become weaker with age. You may have more urinary infections and bladder stones because of the weakened bladder muscles. The muscles may not empty your bladder completely. Also, weakening of the urethral sphincter and the muscles of the pelvic floor can cause stress urinary continence. This is because the sphincter cannot remain tight enough to hold urine in the bladder or does not have enough support from the pelvic muscles.   Urodynamics is the study of how the body stores and releases urine. These tests help your caregiver see how well your bladder and sphincter muscles work. The tests can help explain symptoms such as:   Inability to control your urine (incontinence).   Sudden, strong urges to urinate.   Painful urination.   Recurrent urinary tract infections.   Frequent urination.   Problems starting a urine stream.   Problems emptying your bladder completely.  PREPARATION FOR TEST   If your caregiver recommends bladder testing, usually no special preparations are needed. Make sure you understand any instructions you receive. Depending on the test, you may be asked to come with a full bladder or an empty one. Also, ask whether you should change your diet or skip your regular medicines and for how long.   TAKING THE TEST   Any procedure designed to provide information about a bladder problem can be called a urodynamic test. Your caregiver will want to know whether:   You have difficulty starting a urine stream.   How hard you have to strain to maintain it.   The stream is interrupted.   Any urine is left in your bladder when you are done (post void residual).  Urodynamic tests can range from simple  observation to precise measurement using instruments.   TESTING METHODS   The type of test you take depends on your problem. The different tests are described below.   The use of imaging equipment. This equipment films urination.   Urinating behind a curtain while a doctor or nurse listens.   If leakage is the problem, a pad test is a simple way to measure how much urine seeps out. You will be given a number of absorbent pads and plastic bags. You will be told to wear the pad for 1 or 2 hours and then seal it in a bag. Your caregiver will then weigh the bags to see how much urine has been caught in the pad. A simpler, but less precise method is to change pads as often as you need to and keep track of how many pads you use in a day.   A physical exam will also be performed to rule out other causes of urinary problems. Other causes could include weakening pelvic muscles (pelvic prolapse) or prostate enlargement.   Pressure Flow Study-you will empty your bladder so a catheter can measure the pressures required to urinate. This study helps to identify causes of bladder outlet obstruction that men may experience with prostate enlargement. Bladder outlet obstruction is less common in women but can occur with a fallen bladder or rarely after a surgical procedure for urinary incontinence.   Electromyography (Measurement of Nerve Impulses)-If your caregiver thinks that your   urinary problem is related to nerve damage, you may be given an electromyography. This test measures the muscle activity in the urethral sphincter. It uses sensors placed on the skin near the urethra and rectum. Muscle activity is recorded on a machine. The impulse patterns show if the messages sent to the bladder and urethra are coordinated correctly.   Video Urodynamics-can be performed with or without equipment to take pictures of the bladder during filling and emptying. The imaging equipment may use X-rays or sound waves. If X-ray equipment is used,  the liquid used to fill the bladder may be a contrast medium that will show up on the X-ray. The pictures and videos show the size and shape of the urinary tract and help your caregiver understand your problem.  AFTER THE TEST   You may have mild discomfort for a few hours after these tests. Drinking two, 8-ounce glasses of water, each hour for 2 hours should help. Ask your caregiver if you can take a warm bath. If not, you may be able to hold a warm, damp washcloth over the urethral opening. This may relieve discomfort.   You may be given an antibiotic to prevent an infection. Call your caregiver if you have signs of infection. These signs include pain, chills, or fever.   OBTAINING THE TEST RESULTS   Results for simple tests can be discussed with your caregiver immediately after the test. Results of other tests may take a few days. You will have time to ask questions about the results and possible treatments for your problem.   It is your responsibility to obtain your test results. Ask the lab or department performing the test when and how you will get your results.   FOR MORE INFORMATION   American Urological Association: www.auanet.org   American Foundation for Urologic Disease: www.afud.org   Interstitial Cystitis Association of America: www.ichelp.org   National Kidney and Urologic Diseases Information Clearinghouse: nkudic@info.niddk.nih.gov   Document Released: 08/14/2007 Document Revised: 01/09/2012 Document Reviewed: 08/22/2007   [employer] Patient Information 2014 [employer].

## 2013-04-23 ENCOUNTER — Encounter: Payer: Self-pay | Admitting: Gynecology

## 2013-04-26 ENCOUNTER — Encounter: Payer: Self-pay | Admitting: Gynecology

## 2013-04-26 ENCOUNTER — Telehealth: Payer: Self-pay | Admitting: *Deleted

## 2013-04-26 NOTE — Telephone Encounter (Signed)
Pt informed urodynamics covered with a $20 copay. Apt 7/15 at [facility]

## 2013-05-06 ENCOUNTER — Encounter: Payer: Self-pay | Admitting: Internal Medicine

## 2013-05-14 ENCOUNTER — Ambulatory Visit (INDEPENDENT_AMBULATORY_CARE_PROVIDER_SITE_OTHER): Payer: BC Managed Care – PPO

## 2013-05-22 ENCOUNTER — Ambulatory Visit (INDEPENDENT_AMBULATORY_CARE_PROVIDER_SITE_OTHER): Payer: BC Managed Care – PPO | Admitting: Gynecology

## 2013-05-22 ENCOUNTER — Encounter: Payer: Self-pay | Admitting: Gynecology

## 2013-05-22 VITALS — BP 118/70

## 2013-05-22 DIAGNOSIS — N3941 Urge incontinence: Secondary | ICD-10-CM | POA: Insufficient documentation

## 2013-05-22 MED ORDER — OXYBUTYNIN 3.9 MG/24HR TD PTTW: 1.0000 | MEDICATED_PATCH | TRANSDERMAL | Status: DC

## 2013-05-22 NOTE — Patient Instructions (Addendum)
Oxybutynin skin patch What is this medicine? OXYBUTYNIN (ox i BYOO ti nin) is used to treat overactive bladder. This medicine reduces the amount of bathroom visits. It may also help to control wetting accidents. This medicine may be used for other purposes; ask your health care provider or pharmacist if you have questions. What should I tell my health care provider before I take this medicine? They need to know if you have any of these conditions: -dementia -difficulty passing urine -glaucoma -intestinal obstruction -kidney disease -liver disease -an unusual or allergic reaction to oxybutynin, other medicines, foods, dyes, or preservatives -pregnant or trying to get pregnant -breast-feeding How should I use this medicine? This medicine is for use on the skin. Follow the directions on the prescription label. Find an area of skin on your abdomen, hip, or backside that is clean, dry, greaseless, undamaged and hairless. Remove the patch from the sealed pouch. Do not cut or trim the patch. Using your palm, press the patch firmly in place to make sure that there is good contact with your skin. Change the patch two times per week, keeping to a regular schedule. When you apply a new patch, use a new area of skin. Wait at least 1 week before using the same area again. Talk to your pediatrician regarding the use of this medicine in children. Special care may be needed. Overdosage: If you think you have taken too much of this medicine contact a poison control center or emergency room at once. NOTE: This medicine is only for you. Do not share this medicine with others. What if I miss a dose? If you forget to replace a patch, use it as soon as you can. Only use one patch at a time and do not leave on the skin for longer than directed. If a patch falls off, you can replace it, but keep to your schedule and remove the patch at the right time. What may interact with this medicine? -antihistamines for allergy,  cough and cold -atropine -certain medicines for bladder problems like oxybutynin, tolterodine -certain medicines for Parkinson's disease like benztropine, trihexyphenidyl -certain medicines for stomach problems like dicyclomine, hyoscyamine -certain medicines for travel sickness like scopolamine -clarithromycin -erythromycin -ipratropium -medicines for fungal infections, like fluconazole, itraconazole, ketoconazole or voriconazole This list may not describe all possible interactions. Give your health care provider a list of all the medicines, herbs, non-prescription drugs, or dietary supplements you use. Also tell them if you smoke, drink alcohol, or use illegal drugs. Some items may interact with your medicine. What should I watch for while using this medicine? It may take a few weeks to notice the full benefit from this medicine. You may need to limit your intake tea, coffee, caffeinated sodas, and alcohol. These drinks may make your symptoms worse. You may get drowsy or dizzy. Do not drive, use machinery, or do anything that needs mental alertness until you know how this medicine affects you. Do not stand or sit up quickly, especially if you are an older patient. This reduces the risk of dizzy or fainting spells. Alcohol may interfere with the effect of this medicine. Avoid alcoholic drinks. Your mouth may get dry. Chewing sugarless gum or sucking hard candy, and drinking plenty of water may help. Contact your doctor if the problem does not go away or is severe. This medicine may cause dry eyes and blurred vision. If you wear contact lenses, you may feel some discomfort. Lubricating drops may help. See your eyecare professional if the  problem does not go away or is severe. Avoid extreme heat. This medicine can cause you to sweat less than normal. Your body temperature could increase to dangerous levels, which may lead to heat stroke. Do not expose the patch to sunlight. You should wear it under  your clothes. You can keep the patch in place during swimming, bathing, and showering. If your patch falls off during these activities, replace it. What side effects may I notice from receiving this medicine? Side effects that you should report to your doctor or health care professional as soon as possible: -allergic reactions like skin rash, itching or hives, swelling of the face, lips, or tongue -agitation -breathing problems -confusion -fever -flushing (reddening of the skin) -hallucinations -memory loss -pain or difficulty passing urine -palpitations -unusually weak or tired Side effects that usually do not require medical attention (report to your doctor or health care professional if they continue or are bothersome): -constipation -headache -sexual difficulties (impotence) This list may not describe all possible side effects. Call your doctor for medical advice about side effects. You may report side effects to FDA at 1-800-FDA-1088. Where should I keep my medicine? Keep out of the reach of children. Store at room temperature between 15 and 30 degrees C (59 and 86 degrees F). Protect from moisture and humidity. Do not remove from the package until you are ready to use. Protect from light. When you remove a patch, fold it in half with sticky sides together and throw away. Throw away unused medicine after the expiration date. NOTE: This sheet is a summary. It may not cover all possible information. If you have questions about this medicine, talk to your doctor, pharmacist, or health care provider.  2013, Elsevier/Gold Standard. (05/13/2008 5:24:23 PM) Kegel Exercises The goal of Kegel exercises is to isolate and exercise your pelvic floor muscles. These muscles act as a hammock that supports the rectum, vagina, small intestine, and uterus. As the muscles weaken, the hammock sags and these organs are displaced from their normal positions. Kegel exercises can strengthen your pelvic floor  muscles and help you to improve bladder and bowel control, improve sexual response, and help reduce many problems and some discomfort during pregnancy. Kegel exercises can be done anywhere and at any time. HOW TO PERFORM KEGEL EXERCISES 1. Locate your pelvic floor muscles. To do this, squeeze (contract) the muscles that you use when you try to stop the flow of urine. You will feel a tightness in the vaginal area (women) and a tight lift in the rectal area (men and women). 2. When you begin, contract your pelvic muscles tight for 2 5 seconds, then relax them for 2 5 seconds. This is one set. Do 4 5 sets with a short pause in between. 3. Contract your pelvic muscles for 8 10 seconds, then relax them for 8 10 seconds. Do 4 5 sets. If you cannot contract your pelvic muscles for 8 10 seconds, try 5 7 seconds and work your way up to 8 10 seconds. Your goal is 4 5 sets of 10 contractions each day. Keep your stomach, buttocks, and legs relaxed during the exercises. Perform sets of both short and long contractions. Vary your positions. Perform these contractions 3 4 times per day. Perform sets while you are:   Lying in bed in the morning.  Standing at lunch.  Sitting in the late afternoon.  Lying in bed at night. You should do 40 50 contractions per day. Do not perform more Kegel exercises  per day than recommended. Overexercising can cause muscle fatigue. Continue these exercises for for at least 15 20 weeks or as directed by your caregiver. Document Released: 10/03/2012 Document Reviewed: 07/12/2012 [facility] Patient Information 2014 [place], [place].

## 2013-05-22 NOTE — Progress Notes (Signed)
Patient presented to the office today to discuss her urodynamic evaluation. She was seen in the office for her annual exam in July 23. Patient was complaining of urinary incontinence the same as she did back then. She states that when she coughs or sneeze her time she leaks with minimum effort and she wears a pad all the time. She does have urinary frequency and gets up in the middle the night wants to urinate. Review of patient's records indicated back in 2006 she had been referred to Dr. Ihor Gully at [facility] urology for possible consideration of suburethral sling. She has hesitated. Had also had recommended that she obtain a voiding diary for 30 days and she is not done so. Also we have given her instructions on Kegel exercises.  Exam during that visit demonstrated the following:  Bartholin, Urethra, Skene Glands: Within normal limits  Vagina: No gross lesions or discharge  Cervix: No gross lesions or discharge  Uterus anteverted, normal size, shape and consistency, non-tender and mobile  Adnexa Without masses or tenderness  Anus and perineum normal  Rectovaginal normal sphincter tone without palpated masses or tenderness  Hemoccult none indicated  Patient was examined in the supine and erect position. On Valsalva maneuver she did not leak urine. Q-tip angle test right at 30. Patient does have a first-degree cystocele.  The patient's urodynamic evaluation done here in the office on July 15 demonstrated the following: Her post residual volume was less than 10 cc. Complex uroflowmetry was normal and demonstrated no evidence of obstructive uropathy. Neither genuine stress urinary incontinence nor uninhibited detrusor activity was noted during a multichannel CMG. The patient's cystometric bladder capacity was normal as was her bladder compliance. The patient's history volumes were normal. Maximum urethral closure pressures were equivocal at 20-30 cm of water. 3 channel CMG with flow was within  normal limits giving the testing situation. . Although there was an early sensation possibly a problem with a catheter since patient had voided and the CMG showed high  PVES and PDET at start. The patient did leak at 534 cc when standing and in the supine position with Valsalva. She did not leak at 534 cc 1 cystocele was corrected.  Assessment/plan: Patient with equivocal urodynamic evaluation. Patient appears to have urgency incontinence and leaks only with maximum bladder capacity. She will be given information on Kegel exercises and will be started on anti-cholinergic Oxytrol patch to apply twice a week. She will monitor her symptoms for the next 3-4 months. If there is no improvement I recommend that she followup back with Dr. Vernie Ammons who previously had recommended a sling procedure several years ago. The risks benefits and pros and cons of the medication were discussed.

## 2013-05-24 ENCOUNTER — Encounter: Payer: Self-pay | Admitting: Gynecology

## 2013-09-05 ENCOUNTER — Other Ambulatory Visit: Payer: Self-pay

## 2013-10-08 ENCOUNTER — Encounter: Payer: Self-pay | Admitting: Family Medicine

## 2013-10-08 ENCOUNTER — Ambulatory Visit (INDEPENDENT_AMBULATORY_CARE_PROVIDER_SITE_OTHER): Payer: BC Managed Care – PPO | Admitting: Family Medicine

## 2013-10-08 VITALS — BP 104/60 | HR 87 | Temp 98.1°F | Wt 142.0 lb

## 2013-10-08 DIAGNOSIS — J019 Acute sinusitis, unspecified: Secondary | ICD-10-CM

## 2013-10-08 MED ORDER — AZITHROMYCIN 250 MG PO TABS: ORAL_TABLET | ORAL | Status: DC

## 2013-10-08 NOTE — Progress Notes (Signed)
Pre visit review using our clinic review tool, if applicable. No additional management support is needed unless otherwise documented below in the visit note. 

## 2013-10-08 NOTE — Progress Notes (Signed)
   Subjective:    Patient ID: Danielle Graham, female    DOB: Nov 21, 1965, 47 y.o.   MRN: [id number]  HPI Here for 2 weeks of sinus pressure, PND, ST, and coughing up green sputum. On Mucinex.    Review of Systems  Constitutional: Negative.   HENT: Positive for congestion, postnasal drip and sinus pressure.   Eyes: Negative.   Respiratory: Positive for cough.        Objective:   Physical Exam  Constitutional: She appears well-developed and well-nourished.  HENT:  Right Ear: External ear normal.  Left Ear: External ear normal.  Nose: Nose normal.  Mouth/Throat: Oropharynx is clear and moist.  Eyes: Conjunctivae are normal.  Pulmonary/Chest: Effort normal and breath sounds normal.  Lymphadenopathy:    She has no cervical adenopathy.          Assessment & Plan:  Recheck prn

## 2014-05-09 ENCOUNTER — Encounter: Payer: Self-pay | Admitting: Gynecology

## 2014-08-11 ENCOUNTER — Other Ambulatory Visit (HOSPITAL_COMMUNITY)
Admission: RE | Admit: 2014-08-11 | Discharge: 2014-08-11 | Disposition: A | Discharge status: Home / Self Care | Payer: BC Managed Care – PPO | Source: Ambulatory Visit | Attending: Gynecology | Admitting: Gynecology

## 2014-08-11 ENCOUNTER — Ambulatory Visit (INDEPENDENT_AMBULATORY_CARE_PROVIDER_SITE_OTHER): Payer: BC Managed Care – PPO | Admitting: Gynecology

## 2014-08-11 ENCOUNTER — Encounter: Payer: Self-pay | Admitting: Gynecology

## 2014-08-11 VITALS — BP 118/68 | Ht 64.0 in | Wt 139.4 lb

## 2014-08-11 DIAGNOSIS — N3941 Urge incontinence: Secondary | ICD-10-CM

## 2014-08-11 DIAGNOSIS — Z01419 Encounter for gynecological examination (general) (routine) without abnormal findings: Secondary | ICD-10-CM | POA: Insufficient documentation

## 2014-08-11 DIAGNOSIS — Z124 Encounter for screening for malignant neoplasm of cervix: Secondary | ICD-10-CM

## 2014-08-11 DIAGNOSIS — Z23 Encounter for immunization: Secondary | ICD-10-CM

## 2014-08-11 MED ORDER — MIRABEGRON ER 25 MG PO TB24: 25.0000 mg | ORAL_TABLET | Freq: Every day | ORAL | Status: DC

## 2014-08-11 NOTE — Patient Instructions (Signed)
Kegel Exercises The goal of Kegel exercises is to isolate and exercise your pelvic floor muscles. These muscles act as a hammock that supports the rectum, vagina, small intestine, and uterus. As the muscles weaken, the hammock sags and these organs are displaced from their normal positions. Kegel exercises can strengthen your pelvic floor muscles and help you to improve bladder and bowel control, improve sexual response, and help reduce many problems and some discomfort during pregnancy. Kegel exercises can be done anywhere and at any time. HOW TO PERFORM KEGEL EXERCISES 1. Locate your pelvic floor muscles. To do this, squeeze (contract) the muscles that you use when you try to stop the flow of urine. You will feel a tightness in the vaginal area (women) and a tight lift in the rectal area (men and women). 2. When you begin, contract your pelvic muscles tight for 2-5 seconds, then relax them for 2-5 seconds. This is one set. Do 4-5 sets with a short pause in between. 3. Contract your pelvic muscles for 8-10 seconds, then relax them for 8-10 seconds. Do 4-5 sets. If you cannot contract your pelvic muscles for 8-10 seconds, try 5-7 seconds and work your way up to 8-10 seconds. Your goal is 4-5 sets of 10 contractions each day. Keep your stomach, buttocks, and legs relaxed during the exercises. Perform sets of both short and long contractions. Vary your positions. Perform these contractions 3-4 times per day. Perform sets while you are:   Lying in bed in the morning.  Standing at lunch.  Sitting in the late afternoon.  Lying in bed at night. You should do 40-50 contractions per day. Do not perform more Kegel exercises per day than recommended. Overexercising can cause muscle fatigue. Continue these exercises for for at least 15-20 weeks or as directed by your caregiver. Document Released: 10/03/2012 Document Reviewed: 10/03/2012 [facility] Patient Information 2015 [place], [place]. This information is  not intended to replace advice given to you by your health care provider. Make sure you discuss any questions you have with your health care provider. Mirabegron extended-release tablets What is this medicine? MIRABEGRON (MIR a BEG ron) is used to treat overactive bladder. This medicine reduces the amount of bathroom visits. It may also help to control wetting accidents. This medicine may be used for other purposes; ask your health care provider or pharmacist if you have questions. COMMON BRAND NAME(S): Myrbetriq What should I tell my health care provider before I take this medicine? They need to know if you have any of these conditions: -difficulty passing urine -high blood pressure -kidney disease -liver disease -an unusual or allergic reaction to mirabegron, other medicines, foods, dyes, or preservatives -pregnant or trying to get pregnant -breast-feeding How should I use this medicine? Take this medicine by mouth with a glass of water. Follow the directions on the prescription label. Do not cut, crush or chew this medicine. You can take it with or without food. If it upsets your stomach, take it with food. Take your medicine at regular intervals. Do not take it more often than directed. Do not stop taking except on your doctor's advice. Talk to your pediatrician regarding the use of this medicine in children. Special care may be needed. Overdosage: If you think you've taken too much of this medicine contact a poison control center or emergency room at once. Overdosage: If you think you have taken too much of this medicine contact a poison control center or emergency room at once. NOTE: This medicine is only  for you. Do not share this medicine with others. What if I miss a dose? If you miss a dose, take it as soon as you can. If it is almost time for your next dose, take only that dose. Do not take double or extra doses. What may interact with this medicine? -certain medicines for bladder  problems like fesoterodine, oxybutynin, solifenacin, tolterodine -desipramine -digoxin -flecainide -ketoconazole -MAOIs like Carbex, Eldepryl, Marplan, Nardil, and Parnate -metoprolol -propafenone -thioridazine -warfarin This list may not describe all possible interactions. Give your health care provider a list of all the medicines, herbs, non-prescription drugs, or dietary supplements you use. Also tell them if you smoke, drink alcohol, or use illegal drugs. Some items may interact with your medicine. What should I watch for while using this medicine? It may take 8 weeks to notice the full benefit from this medicine. You may need to limit your intake tea, coffee, caffeinated sodas, and alcohol. These drinks may make your symptoms worse. Visit your doctor or health care professional for regular checks on your progress. Check your blood pressure as directed. Ask your doctor or health care professional what your blood pressure should be and when you should contact him or her. What side effects may I notice from receiving this medicine? Side effects that you should report to your doctor or health care professional as soon as possible: -allergic reactions like skin rash, itching or hives, swelling of the face, lips, or tongue -chest pain or palpitations -severe or sudden headache -high blood pressure -fast, irregular heartbeat -redness, blistering, peeling or loosening of the skin, including inside the mouth -signs of infection - fever or chills, pain or difficulty passing urine -trouble passing urine or change in the amount of urine Side effects that usually do not require medical attention (Report these to your doctor or health care professional if they continue or are bothersome.): -constipation -dry eyes -joint pain -mild headache -nausea -runny nose This list may not describe all possible side effects. Call your doctor for medical advice about side effects. You may report side  effects to FDA at 1-800-FDA-1088. Where should I keep my medicine? Keep out of the reach of children. Store at room temperature between 15 and 30 degrees C (59 and 86 degrees F). Throw away any unused medicine after the expiration date. NOTE: This sheet is a summary. It may not cover all possible information. If you have questions about this medicine, talk to your doctor, pharmacist, or health care provider.  2015, [employer]vier/[employer] Standard. (2012-06-29 15:59:47) Influenza Virus Vaccine injection (Fluarix) What is this medicine? INFLUENZA VIRUS VACCINE (in floo EN zuh VAHY ruhs vak SEEN) helps to reduce the risk of getting influenza also known as the flu. This medicine may be used for other purposes; ask your health care provider or pharmacist if you have questions. COMMON BRAND NAME(S): Fluarix, Fluzone What should I tell my health care provider before I take this medicine? They need to know if you have any of these conditions: -bleeding disorder like hemophilia -fever or infection -Guillain-Barre syndrome or other neurological problems -immune system problems -infection with the human immunodeficiency virus (HIV) or AIDS -low blood platelet counts -multiple sclerosis -an unusual or allergic reaction to influenza virus vaccine, eggs, chicken proteins, latex, gentamicin, other medicines, foods, dyes or preservatives -pregnant or trying to get pregnant -breast-feeding How should I use this medicine? This vaccine is for injection into a muscle. It is given by a health care professional. A copy of Vaccine Information Statements will  be given before each vaccination. Read this sheet carefully each time. The sheet may change frequently. Talk to your pediatrician regarding the use of this medicine in children. Special care may be needed. Overdosage: If you think you have taken too much of this medicine contact a poison control center or emergency room at once. NOTE: This medicine is only for you.  Do not share this medicine with others. What if I miss a dose? This does not apply. What may interact with this medicine? -chemotherapy or radiation therapy -medicines that lower your immune system like etanercept, anakinra, infliximab, and adalimumab -medicines that treat or prevent blood clots like warfarin -phenytoin -steroid medicines like prednisone or cortisone -theophylline -vaccines This list may not describe all possible interactions. Give your health care provider a list of all the medicines, herbs, non-prescription drugs, or dietary supplements you use. Also tell them if you smoke, drink alcohol, or use illegal drugs. Some items may interact with your medicine. What should I watch for while using this medicine? Report any side effects that do not go away within 3 days to your doctor or health care professional. Call your health care provider if any unusual symptoms occur within 6 weeks of receiving this vaccine. You may still catch the flu, but the illness is not usually as bad. You cannot get the flu from the vaccine. The vaccine will not protect against colds or other illnesses that may cause fever. The vaccine is needed every year. What side effects may I notice from receiving this medicine? Side effects that you should report to your doctor or health care professional as soon as possible: -allergic reactions like skin rash, itching or hives, swelling of the face, lips, or tongue Side effects that usually do not require medical attention (report to your doctor or health care professional if they continue or are bothersome): -fever -headache -muscle aches and pains -pain, tenderness, redness, or swelling at site where injected -weak or tired This list may not describe all possible side effects. Call your doctor for medical advice about side effects. You may report side effects to FDA at 1-800-FDA-1088. Where should I keep my medicine? This vaccine is only given in a clinic,  pharmacy, doctor's office, or other health care setting and will not be stored at home. NOTE: This sheet is a summary. It may not cover all possible information. If you have questions about this medicine, talk to your doctor, pharmacist, or health care provider.  2015, Elsevier/Gold Standard. (2008-05-14 09:30:40)

## 2014-08-11 NOTE — Progress Notes (Signed)
Danielle MarekLisa P Graham 10/28/1966 [id number]   History:    48 y.o.  for GYN exam and who continues to suffer from urgency incontinence. She had also stated when she was seen in the office on July 23 of last year that when she coughs or sneeze her time she leaks with minimum effort and she wears a pad all the time. She does have urinary frequency and gets up in the middle the night wants to urinate. Review of patient's records indicated back in 2006 she had been referred to Danielle Graham at [facility] for possible consideration of suburethral sling. She has hesitated. Had also had recommended that she obtain a voiding diary for 30 days and she is not done so. Also we have given her instructions on Kegel exercises. Patient did not follow up with the urologist. Patient with no past history of any abnormal Pap smears. Her father had history of colon cancer. Patient had a colonoscopy in 2010 which was normal although she states that she has had benign polyps in the past.Patient's primary physician is DaniellePanosh who has done all patient's lab work in January of this year.  Review of her record indicated that in 2005 she had AGUS (atypical glandular cells of undetermined significance) and had a negative cervical conization and subsequent Pap smears have all been negative.    Patient requesting flu vaccine today. Patient still menstruating regularly and has no vasomotor symptoms.   Past medical history,surgical history, family history and social history were all reviewed and documented in the EPIC chart.  Gynecologic History Patient's last menstrual period was 08/04/2014. Contraception: vasectomy Last Pap: 2014. Results were: normal Last mammogram: 2015. Results were: Normal but dense had three-dimensional mammogram  Obstetric History OB History  Gravida Para Term Preterm AB SAB TAB Ectopic Multiple Living  4 2 2  2 2    2     # Outcome Date GA Lbr Len/2nd Weight Sex Delivery Anes PTL Lv  4 SAB            3 SAB           2 TRM     M SVD  N Y  1 TRM     F SVD  N Y       ROS: A ROS was performed and pertinent positives and negatives are included in the history.  GENERAL: No fevers or chills. HEENT: No change in vision, no earache, sore throat or sinus congestion. NECK: No pain or stiffness. CARDIOVASCULAR: No chest pain or pressure. No palpitations. PULMONARY: No shortness of breath, cough or wheeze. GASTROINTESTINAL: No abdominal pain, nausea, vomiting or diarrhea, melena or bright red blood per rectum. GENITOURINARY: No urinary frequency, urgency, hesitancy or dysuria. MUSCULOSKELETAL: No joint or muscle pain, no back pain, no recent trauma. DERMATOLOGIC: No rash, no itching, no lesions. ENDOCRINE: No polyuria, polydipsia, no heat or cold intolerance. No recent change in weight. HEMATOLOGICAL: No anemia or easy bruising or bleeding. NEUROLOGIC: No headache, seizures, numbness, tingling or weakness. PSYCHIATRIC: No depression, no loss of interest in normal activity or change in sleep pattern.     Exam: chaperone present  BP 118/68  Ht 5\' 4"  (1.626 m)  Wt 139 lb 6.4 oz (63.231 kg)  BMI 23.92 kg/m2  LMP 08/04/2014  Body mass index is 23.92 kg/(m^2).  General appearance : Well developed well nourished female. No acute distress HEENT: Neck supple, trachea midline, no carotid bruits, no thyroidmegaly Lungs: Clear to auscultation, no rhonchi  or wheezes, or rib retractions  Heart: Regular rate and rhythm, no murmurs or gallops Breast:Examined in sitting and supine position were symmetrical in appearance, no palpable masses or tenderness,  no skin retraction, no nipple inversion, no nipple discharge, no skin discoloration, no axillary or supraclavicular lymphadenopathy Abdomen: no palpable masses or tenderness, no rebound or guarding Extremities: no edema or skin discoloration or tenderness  Pelvic:  Bartholin, Urethra, Skene Glands: Within normal limits             Vagina: No gross  lesions or discharge  Cervix: No gross lesions or discharge  Uterus  anteverted, normal size, shape and consistency, non-tender and mobile  Adnexa  Without masses or tenderness  Anus and perineum  normal   Rectovaginal  normal sphincter tone without palpated masses or tenderness             Hemoccult fecal Hemoccult cards and given     Assessment/Plan:  48 y.o. female for annual exam with nocturia and urgency incontinence she will be given literature and information on Kegel exercises which she now states she'll begin to adhere to. We talked about medical treatment such as with Myrbetriq 25 mg 1 by mouth daily for which literature and information was provided. Her Pap smear was done today. She was provided with fecal Hemoccult cards and reminded to submit to the office for testing and to follow up with her gastroenterologist for followup colonoscopy. We discussed importance of calcium vitamin D and regular exercise for osteoporosis prevention. Patient received the flu vaccine today.  Danielle Graham,Danielle Graham, 5:24 PM 08/11/2014

## 2014-08-13 LAB — CYTOLOGY - PAP

## 2014-09-01 ENCOUNTER — Encounter: Payer: Self-pay | Admitting: Gynecology

## 2014-09-30 ENCOUNTER — Other Ambulatory Visit: Payer: BC Managed Care – PPO | Admitting: Anesthesiology

## 2014-09-30 DIAGNOSIS — Z1211 Encounter for screening for malignant neoplasm of colon: Secondary | ICD-10-CM

## 2014-10-15 ENCOUNTER — Other Ambulatory Visit (INDEPENDENT_AMBULATORY_CARE_PROVIDER_SITE_OTHER): Payer: BC Managed Care – PPO

## 2014-10-15 DIAGNOSIS — Z0000 Encounter for general adult medical examination without abnormal findings: Secondary | ICD-10-CM

## 2014-10-15 LAB — HEPATIC FUNCTION PANEL
ALK PHOS: 40 U/L (ref 39–117)
ALT: 16 U/L (ref 0–35)
AST: 21 U/L (ref 0–37)
Albumin: 3.9 g/dL (ref 3.5–5.2)
BILIRUBIN DIRECT: 0 mg/dL (ref 0.0–0.3)
TOTAL PROTEIN: 6.5 g/dL (ref 6.0–8.3)
Total Bilirubin: 0.4 mg/dL (ref 0.2–1.2)

## 2014-10-15 LAB — BASIC METABOLIC PANEL
BUN: 20 mg/dL (ref 6–23)
CALCIUM: 8.6 mg/dL (ref 8.4–10.5)
CO2: 23 mEq/L (ref 19–32)
CREATININE: 0.8 mg/dL (ref 0.4–1.2)
Chloride: 109 mEq/L (ref 96–112)
GFR: 77.07 mL/min (ref >60.00)
Glucose, Bld: 97 mg/dL (ref 70–99)
Potassium: 4.5 mEq/L (ref 3.5–5.1)
Sodium: 138 mEq/L (ref 135–145)

## 2014-10-15 LAB — CBC WITH DIFFERENTIAL/PLATELET
Basophils Absolute: 0 10*3/uL (ref 0.0–0.1)
Basophils Relative: 0.7 % (ref 0.0–3.0)
EOS ABS: 0.4 10*3/uL (ref 0.0–0.7)
Eosinophils Relative: 5.7 % — ABNORMAL HIGH (ref 0.0–5.0)
HCT: 40.8 % (ref 36.0–46.0)
Hemoglobin: 13.3 g/dL (ref 12.0–15.0)
LYMPHS PCT: 22.7 % (ref 12.0–46.0)
Lymphs Abs: 1.7 10*3/uL (ref 0.7–4.0)
MCHC: 32.5 g/dL (ref 30.0–36.0)
MCV: 91.4 fl (ref 78.0–100.0)
Monocytes Absolute: 0.6 10*3/uL (ref 0.1–1.0)
Monocytes Relative: 7.9 % (ref 3.0–12.0)
NEUTROS PCT: 63 % (ref 43.0–77.0)
Neutro Abs: 4.6 10*3/uL (ref 1.4–7.7)
Platelets: 305 10*3/uL (ref 150.0–400.0)
RBC: 4.47 Mil/uL (ref 3.87–5.11)
RDW: 14.3 % (ref 11.5–15.5)
WBC: 7.3 10*3/uL (ref 4.0–10.5)

## 2014-10-15 LAB — LIPID PANEL
CHOL/HDL RATIO: 4
Cholesterol: 233 mg/dL — ABNORMAL HIGH (ref 0–200)
HDL: 60.5 mg/dL (ref >39.00)
LDL CALC: 159 mg/dL — AB (ref 0–99)
NonHDL: 172.5
TRIGLYCERIDES: 67 mg/dL (ref 0.0–149.0)
VLDL: 13.4 mg/dL (ref 0.0–40.0)

## 2014-10-15 LAB — TSH: TSH: 2.48 u[IU]/mL (ref 0.35–4.50)

## 2014-10-17 ENCOUNTER — Telehealth: Payer: Self-pay | Admitting: Internal Medicine

## 2014-10-17 NOTE — Telephone Encounter (Signed)
Pt has been scheduled 12/22 b/c dr Fabian Sharppanosh will work that day, Thank you. Nothing else is needed.

## 2014-10-17 NOTE — Telephone Encounter (Signed)
Pt had cpe scheduled 12/18, but dr Fabian Sharppanosh is going to be out and pt must resch.  Pt asked if there is anyway we can work in 12/30 or 12/31? There are some same day slots, but would have to put together. [facility]sa advise if ok to schedule.

## 2014-10-21 ENCOUNTER — Encounter: Payer: Self-pay | Admitting: Internal Medicine

## 2014-10-21 ENCOUNTER — Ambulatory Visit (INDEPENDENT_AMBULATORY_CARE_PROVIDER_SITE_OTHER): Payer: BC Managed Care – PPO | Admitting: Internal Medicine

## 2014-10-21 VITALS — BP 120/80 | Temp 98.5°F | Ht 63.75 in | Wt 143.2 lb

## 2014-10-21 DIAGNOSIS — E785 Hyperlipidemia, unspecified: Secondary | ICD-10-CM | POA: Insufficient documentation

## 2014-10-21 DIAGNOSIS — Z86010 Personal history of colonic polyps: Secondary | ICD-10-CM

## 2014-10-21 DIAGNOSIS — Z0000 Encounter for general adult medical examination without abnormal findings: Secondary | ICD-10-CM

## 2014-10-21 DIAGNOSIS — Z800 Family history of malignant neoplasm of digestive organs: Secondary | ICD-10-CM

## 2014-10-21 NOTE — Progress Notes (Signed)
Pre visit review using our clinic review tool, if applicable. No additional management support is needed unless otherwise documented below in the visit note. 

## 2014-10-21 NOTE — Patient Instructions (Addendum)
Exam and labs are good today. Cholesterol can be better   Healthy lifestyle includes : At least 150 minutes of exercise weeks  ,  Add resistance training   To  Cardio.  weight at healthy levels, which is usually   BMI 19-25. Avoid trans fats and processed foods;  Increase fresh fruits and veges to 5 servings per day. And avoid sweet beverages including tea and juice. Mediterranean diet with olive oil and nuts have been noted to be heart and brain healthy . Avoid tobacco products . Limit  alcohol to  7 per week for women and 14 servings for men.  Get adequate sleep . Wear seat belts . Don't text and drive .      Why follow it? Research shows. . Those who follow the Mediterranean diet have a reduced risk of heart disease  . The diet is associated with a reduced incidence of Parkinson's and Alzheimer's diseases . People following the diet may have longer life expectancies and lower rates of chronic diseases  . The Dietary Guidelines for Americans recommends the Mediterranean diet as an eating plan to promote health and prevent disease  What Is the Mediterranean Diet?  . Healthy eating plan based on typical foods and recipes of Mediterranean-style cooking . The diet is primarily a plant based diet; these foods should make up a majority of meals   Starches - Plant based foods should make up a majority of meals - They are an important sources of vitamins, minerals, energy, antioxidants, and fiber - Choose whole grains, foods high in fiber and minimally processed items  - Typical grain sources include wheat, oats, barley, corn, brown rice, bulgar, farro, millet, polenta, couscous  - Various types of beans include chickpeas, lentils, fava beans, black beans, white beans   Fruits  Veggies - Large quantities of antioxidant rich fruits & veggies; 6 or more servings  - Vegetables can be eaten raw or lightly drizzled with oil and cooked  - Vegetables common to the traditional Mediterranean Diet  include: artichokes, arugula, beets, broccoli, brussel sprouts, cabbage, carrots, celery, collard greens, cucumbers, eggplant, kale, leeks, lemons, lettuce, mushrooms, okra, onions, peas, peppers, potatoes, pumpkin, radishes, rutabaga, shallots, spinach, sweet potatoes, turnips, zucchini - Fruits common to the Mediterranean Diet include: apples, apricots, avocados, cherries, clementines, dates, figs, grapefruits, grapes, melons, nectarines, oranges, peaches, pears, pomegranates, strawberries, tangerines  Fats - Replace butter and margarine with healthy oils, such as olive oil, canola oil, and tahini  - Limit nuts to no more than a handful a day  - Nuts include walnuts, almonds, pecans, pistachios, pine nuts  - Limit or avoid candied, honey roasted or heavily salted nuts - Olives are central to the [employer] - can be eaten whole or used in a variety of dishes   Meats Protein - Limiting red meat: no more than a few times a month - When eating red meat: choose lean cuts and keep the portion to the size of deck of cards - Eggs: approx. 0 to 4 times a week  - Fish and lean poultry: at least 2 a week  - Healthy protein sources include, chicken, [place], lean beef, lamb - Increase intake of seafood such as tuna, salmon, trout, mackerel, shrimp, scallops - Avoid or limit high fat processed meats such as sausage and bacon  Dairy - Include moderate amounts of low fat dairy products  - Focus on healthy dairy such as fat free yogurt, skim milk, low or reduced fat cheese -  Limit dairy products higher in fat such as whole or 2% milk, cheese, ice cream  Alcohol - Moderate amounts of red wine is ok  - No more than 5 oz daily for women (all ages) and men older than age 18  - No more than 10 oz of wine daily for men younger than 73  Other - Limit sweets and other desserts  - Use herbs and spices instead of salt to flavor foods  - Herbs and spices common to the traditional Mediterranean Diet include:  basil, bay leaves, chives, cloves, cumin, fennel, garlic, lavender, marjoram, mint, oregano, parsley, pepper, rosemary, sage, savory, sumac, tarragon, thyme   It's not just a diet, it's a lifestyle:  . The Mediterranean diet includes lifestyle factors typical of those in the region  . Foods, drinks and meals are best eaten with others and savored . Daily physical activity is important for overall good health . This could be strenuous exercise like running and aerobics . This could also be more leisurely activities such as walking, housework, yard-work, or taking the stairs . Moderation is the key; a balanced and healthy diet accommodates most foods and drinks . Consider portion sizes and frequency of consumption of certain foods   Meal Ideas & Options:  . Breakfast:  o Whole wheat toast or whole wheat English muffins with peanut butter & hard boiled egg o Steel cut oats topped with apples & cinnamon and skim milk  o Fresh fruit: banana, strawberries, melon, berries, peaches  o Smoothies: strawberries, bananas, greek yogurt, peanut butter o Low fat greek yogurt with blueberries and granola  o Egg white omelet with spinach and mushrooms o Breakfast couscous: whole wheat couscous, apricots, skim milk, cranberries  . Sandwiches:  o Hummus and grilled vegetables (peppers, zucchini, squash) on whole wheat bread   o Grilled chicken on whole wheat pita with lettuce, tomatoes, cucumbers or tzatziki  o Tuna salad on whole wheat bread: tuna salad made with greek yogurt, olives, red peppers, capers, green onions o Garlic rosemary lamb pita: lamb sauted with garlic, rosemary, salt & pepper; add lettuce, cucumber, greek yogurt to pita - flavor with lemon juice and black pepper  . Seafood:  o Mediterranean grilled salmon, seasoned with garlic, basil, parsley, lemon juice and black pepper o Shrimp, lemon, and spinach whole-grain pasta salad made with low fat greek yogurt  o Seared scallops with lemon  orzo  o Seared tuna steaks seasoned salt, pepper, coriander topped with tomato mixture of olives, tomatoes, olive oil, minced garlic, parsley, green onions and cappers  . Meats:  o Herbed greek chicken salad with kalamata olives, cucumber, feta  o Red bell peppers stuffed with spinach, bulgur, lean ground beef (or lentils) & topped with feta   o Kebabs: skewers of chicken, tomatoes, onions, zucchini, squash  o [place] burgers: made with red onions, mint, dill, lemon juice, feta cheese topped with roasted red peppers . Vegetarian o Cucumber salad: cucumbers, artichoke hearts, celery, red onion, feta cheese, tossed in olive oil & lemon juice  o Hummus and whole grain pita points with a greek salad (lettuce, tomato, feta, olives, cucumbers, red onion) o Lentil soup with celery, carrots made with vegetable broth, garlic, salt and pepper  o Tabouli salad: parsley, bulgur, mint, scallions, cucumbers, tomato, radishes, lemon juice, olive oil, salt and pepper.       Based on ascvd 2013 risk guidelines  10 year risk of events  1.2% to 0.8%  Lifetime risk 8 % and  39 % calculated statins not  A benefit at this time   Preventive Care for Adults A healthy lifestyle and preventive care can promote health and wellness. Preventive health guidelines for women include the following key practices.  A routine yearly physical is a good way to check with your health care provider about your health and preventive screening. It is a chance to share any concerns and updates on your health and to receive a thorough exam.  Visit your dentist for a routine exam and preventive care every 6 months. Brush your teeth twice a day and floss once a day. Good oral hygiene prevents tooth decay and gum disease.  The frequency of eye exams is based on your age, health, family medical history, use of contact lenses, and other factors. Follow your health care provider's recommendations for frequency of eye exams.  Eat a healthy  diet. Foods like vegetables, fruits, whole grains, low-fat dairy products, and lean protein foods contain the nutrients you need without too many calories. Decrease your intake of foods high in solid fats, added sugars, and salt. Eat the right amount of calories for you.Get information about a proper diet from your health care provider, if necessary.  Regular physical exercise is one of the most important things you can do for your health. Most adults should get at least 150 minutes of moderate-intensity exercise (any activity that increases your heart rate and causes you to sweat) each week. In addition, most adults need muscle-strengthening exercises on 2 or more days a week.  Maintain a healthy weight. The body mass index (BMI) is a screening tool to identify possible weight problems. It provides an estimate of body fat based on height and weight. Your health care provider can find your BMI and can help you achieve or maintain a healthy weight.For adults 20 years and older:  A BMI below 18.5 is considered underweight.  A BMI of 18.5 to 24.9 is normal.  A BMI of 25 to 29.9 is considered overweight.  A BMI of 30 and above is considered obese.  Maintain normal blood lipids and cholesterol levels by exercising and minimizing your intake of saturated fat. Eat a balanced diet with plenty of fruit and vegetables. Blood tests for lipids and cholesterol should begin at age 2 and be repeated every 5 years. If your lipid or cholesterol levels are high, you are over 50, or you are at high risk for heart disease, you may need your cholesterol levels checked more frequently.Ongoing high lipid and cholesterol levels should be treated with medicines if diet and exercise are not working.  If you smoke, find out from your health care provider how to quit. If you do not use tobacco, do not start.  Lung cancer screening is recommended for adults aged 77-80 years who are at high risk for developing lung cancer  because of a history of smoking. A yearly low-dose CT scan of the lungs is recommended for people who have at least a 30-pack-year history of smoking and are a current smoker or have quit within the past 15 years. A pack year of smoking is smoking an average of 1 pack of cigarettes a day for 1 year (for example: 1 pack a day for 30 years or 2 packs a day for 15 years). Yearly screening should continue until the smoker has stopped smoking for at least 15 years. Yearly screening should be stopped for people who develop a health problem that would prevent them from having lung cancer  treatment.  If you are pregnant, do not drink alcohol. If you are breastfeeding, be very cautious about drinking alcohol. If you are not pregnant and choose to drink alcohol, do not have more than 1 drink per day. One drink is considered to be 12 ounces (355 mL) of beer, 5 ounces (148 mL) of wine, or 1.5 ounces (44 mL) of liquor.  Avoid use of street drugs. Do not share needles with anyone. Ask for help if you need support or instructions about stopping the use of drugs.  High blood pressure causes heart disease and increases the risk of stroke. Your blood pressure should be checked at least every 1 to 2 years. Ongoing high blood pressure should be treated with medicines if weight loss and exercise do not work.  If you are 40-11 years old, ask your health care provider if you should take aspirin to prevent strokes.  Diabetes screening involves taking a blood sample to check your fasting blood sugar level. This should be done once every 3 years, after age 45, if you are within normal weight and without risk factors for diabetes. Testing should be considered at a younger age or be carried out more frequently if you are overweight and have at least 1 risk factor for diabetes.  Breast cancer screening is essential preventive care for women. You should practice "breast self-awareness." This means understanding the normal appearance  and feel of your breasts and may include breast self-examination. Any changes detected, no matter how small, should be reported to a health care provider. Women in their 49s and 30s should have a clinical breast exam (CBE) by a health care provider as part of a regular health exam every 1 to 3 years. After age 48, women should have a CBE every year. Starting at age 80, women should consider having a mammogram (breast X-ray test) every year. Women who have a family history of breast cancer should talk to their health care provider about genetic screening. Women at a high risk of breast cancer should talk to their health care providers about having an MRI and a mammogram every year.  Breast cancer gene (BRCA)-related cancer risk assessment is recommended for women who have family members with BRCA-related cancers. BRCA-related cancers include breast, ovarian, tubal, and peritoneal cancers. Having family members with these cancers may be associated with an increased risk for harmful changes (mutations) in the breast cancer genes BRCA1 and BRCA2. Results of the assessment will determine the need for genetic counseling and BRCA1 and BRCA2 testing.  Routine pelvic exams to screen for cancer are no longer recommended for nonpregnant women who are considered low risk for cancer of the pelvic organs (ovaries, uterus, and vagina) and who do not have symptoms. Ask your health care provider if a screening pelvic exam is right for you.  If you have had past treatment for cervical cancer or a condition that could lead to cancer, you need Pap tests and screening for cancer for at least 20 years after your treatment. If Pap tests have been discontinued, your risk factors (such as having a new sexual partner) need to be reassessed to determine if screening should be resumed. Some women have medical problems that increase the chance of getting cervical cancer. In these cases, your health care provider may recommend more  frequent screening and Pap tests.  The HPV test is an additional test that may be used for cervical cancer screening. The HPV test looks for the virus that can cause the  cell changes on the cervix. The cells collected during the Pap test can be tested for HPV. The HPV test could be used to screen women aged 61 years and older, and should be used in women of any age who have unclear Pap test results. After the age of 30, women should have HPV testing at the same frequency as a Pap test.  Colorectal cancer can be detected and often prevented. Most routine colorectal cancer screening begins at the age of 65 years and continues through age 40 years. However, your health care provider may recommend screening at an earlier age if you have risk factors for colon cancer. On a yearly basis, your health care provider may provide home test kits to check for hidden blood in the stool. Use of a small camera at the end of a tube, to directly examine the colon (sigmoidoscopy or colonoscopy), can detect the earliest forms of colorectal cancer. Talk to your health care provider about this at age 41, when routine screening begins. Direct exam of the colon should be repeated every 5-10 years through age 88 years, unless early forms of pre-cancerous polyps or small growths are found.  People who are at an increased risk for hepatitis B should be screened for this virus. You are considered at high risk for hepatitis B if:  You were born in a country where hepatitis B occurs often. Talk with your health care provider about which countries are considered high risk.  Your parents were born in a high-risk country and you have not received a shot to protect against hepatitis B (hepatitis B vaccine).  You have HIV or AIDS.  You use needles to inject street drugs.  You live with, or have sex with, someone who has hepatitis B.  You get hemodialysis treatment.  You take certain medicines for conditions like cancer, organ  transplantation, and autoimmune conditions.  Hepatitis C blood testing is recommended for all people born from [birth year] through 1967 and any individual with known risks for hepatitis C.  Practice safe sex. Use condoms and avoid high-risk sexual practices to reduce the spread of sexually transmitted infections (STIs). STIs include gonorrhea, chlamydia, syphilis, trichomonas, herpes, HPV, and human immunodeficiency virus (HIV). Herpes, HIV, and HPV are viral illnesses that have no cure. They can result in disability, cancer, and death.  You should be screened for sexually transmitted illnesses (STIs) including gonorrhea and chlamydia if:  You are sexually active and are younger than 24 years.  You are older than 24 years and your health care provider tells you that you are at risk for this type of infection.  Your sexual activity has changed since you were last screened and you are at an increased risk for chlamydia or gonorrhea. Ask your health care provider if you are at risk.  If you are at risk of being infected with HIV, it is recommended that you take a prescription medicine daily to prevent HIV infection. This is called preexposure prophylaxis (PrEP). You are considered at risk if:  You are a heterosexual woman, are sexually active, and are at increased risk for HIV infection.  You take drugs by injection.  You are sexually active with a partner who has HIV.  Talk with your health care provider about whether you are at high risk of being infected with HIV. If you choose to begin PrEP, you should first be tested for HIV. You should then be tested every 3 months for as long as you are taking PrEP.  Osteoporosis is a disease in which the bones lose minerals and strength with aging. This can result in serious bone fractures or breaks. The risk of osteoporosis can be identified using a bone density scan. Women ages 60 years and over and women at risk for fractures or osteoporosis should discuss  screening with their health care providers. Ask your health care provider whether you should take a calcium supplement or vitamin D to reduce the rate of osteoporosis.  Menopause can be associated with physical symptoms and risks. Hormone replacement therapy is available to decrease symptoms and risks. You should talk to your health care provider about whether hormone replacement therapy is right for you.  Use sunscreen. Apply sunscreen liberally and repeatedly throughout the day. You should seek shade when your shadow is shorter than you. Protect yourself by wearing long sleeves, pants, a wide-brimmed hat, and sunglasses year round, whenever you are outdoors.  Once a month, do a whole body skin exam, using a mirror to look at the skin on your back. Tell your health care provider of new moles, moles that have irregular borders, moles that are larger than a pencil eraser, or moles that have changed in shape or color.  Stay current with required vaccines (immunizations).  Influenza vaccine. All adults should be immunized every year.  Tetanus, diphtheria, and acellular pertussis (Td, Tdap) vaccine. Pregnant women should receive 1 dose of Tdap vaccine during each pregnancy. The dose should be obtained regardless of the length of time since the last dose. Immunization is preferred during the 27th-36th week of gestation. An adult who has not previously received Tdap or who does not know her vaccine status should receive 1 dose of Tdap. This initial dose should be followed by tetanus and diphtheria toxoids (Td) booster doses every 10 years. Adults with an unknown or incomplete history of completing a 3-dose immunization series with Td-containing vaccines should begin or complete a primary immunization series including a Tdap dose. Adults should receive a Td booster every 10 years.  Varicella vaccine. An adult without evidence of immunity to varicella should receive 2 doses or a second dose if she has  previously received 1 dose. Pregnant females who do not have evidence of immunity should receive the first dose after pregnancy. This first dose should be obtained before leaving the health care facility. The second dose should be obtained 4-8 weeks after the first dose.  Human papillomavirus (HPV) vaccine. Females aged 13-26 years who have not received the vaccine previously should obtain the 3-dose series. The vaccine is not recommended for use in pregnant females. However, pregnancy testing is not needed before receiving a dose. If a female is found to be pregnant after receiving a dose, no treatment is needed. In that case, the remaining doses should be delayed until after the pregnancy. Immunization is recommended for any person with an immunocompromised condition through the age of 53 years if she did not get any or all doses earlier. During the 3-dose series, the second dose should be obtained 4-8 weeks after the first dose. The third dose should be obtained 24 weeks after the first dose and 16 weeks after the second dose.  Zoster vaccine. One dose is recommended for adults aged 55 years or older unless certain conditions are present.  Measles, mumps, and rubella (MMR) vaccine. Adults born before [birth year] generally are considered immune to measles and mumps. Adults born in [birth year] or later should have 1 or more doses of MMR vaccine unless there is a  contraindication to the vaccine or there is laboratory evidence of immunity to each of the three diseases. A routine second dose of MMR vaccine should be obtained at least 28 days after the first dose for students attending postsecondary schools, health care workers, or international travelers. People who received inactivated measles vaccine or an unknown type of measles vaccine during 1963-1967 should receive 2 doses of MMR vaccine. People who received inactivated mumps vaccine or an unknown type of mumps vaccine before 1979 and are at high risk for mumps  infection should consider immunization with 2 doses of MMR vaccine. For females of childbearing age, rubella immunity should be determined. If there is no evidence of immunity, females who are not pregnant should be vaccinated. If there is no evidence of immunity, females who are pregnant should delay immunization until after pregnancy. Unvaccinated health care workers born before [birth year] who lack laboratory evidence of measles, mumps, or rubella immunity or laboratory confirmation of disease should consider measles and mumps immunization with 2 doses of MMR vaccine or rubella immunization with 1 dose of MMR vaccine.  Pneumococcal 13-valent conjugate (PCV13) vaccine. When indicated, a person who is uncertain of her immunization history and has no record of immunization should receive the PCV13 vaccine. An adult aged 16 years or older who has certain medical conditions and has not been previously immunized should receive 1 dose of PCV13 vaccine. This PCV13 should be followed with a dose of pneumococcal polysaccharide (PPSV23) vaccine. The PPSV23 vaccine dose should be obtained at least 8 weeks after the dose of PCV13 vaccine. An adult aged 54 years or older who has certain medical conditions and previously received 1 or more doses of PPSV23 vaccine should receive 1 dose of PCV13. The PCV13 vaccine dose should be obtained 1 or more years after the last PPSV23 vaccine dose.  Pneumococcal polysaccharide (PPSV23) vaccine. When PCV13 is also indicated, PCV13 should be obtained first. All adults aged 4 years and older should be immunized. An adult younger than age 75 years who has certain medical conditions should be immunized. Any person who resides in a nursing home or long-term care facility should be immunized. An adult smoker should be immunized. People with an immunocompromised condition and certain other conditions should receive both PCV13 and PPSV23 vaccines. People with human immunodeficiency virus (HIV)  infection should be immunized as soon as possible after diagnosis. Immunization during chemotherapy or radiation therapy should be avoided. Routine use of PPSV23 vaccine is not recommended for American Indians, [place]atives, or people younger than 65 years unless there are medical conditions that require PPSV23 vaccine. When indicated, people who have unknown immunization and have no record of immunization should receive PPSV23 vaccine. One-time revaccination 5 years after the first dose of PPSV23 is recommended for people aged 19-64 years who have chronic kidney failure, nephrotic syndrome, asplenia, or immunocompromised conditions. People who received 1-2 doses of PPSV23 before age 67 years should receive another dose of PPSV23 vaccine at age 29 years or later if at least 5 years have passed since the previous dose. Doses of PPSV23 are not needed for people immunized with PPSV23 at or after age 72 years.  Meningococcal vaccine. Adults with asplenia or persistent complement component deficiencies should receive 2 doses of quadrivalent meningococcal conjugate (MenACWY-D) vaccine. The doses should be obtained at least 2 months apart. Microbiologists working with certain meningococcal bacteria, [employer], people at risk during an outbreak, and people who travel to or live in countries with a high rate  of meningitis should be immunized. A first-year college student up through age 79 years who is living in a residence hall should receive a dose if she did not receive a dose on or after her 16th birthday. Adults who have certain high-risk conditions should receive one or more doses of vaccine.  Hepatitis A vaccine. Adults who wish to be protected from this disease, have certain high-risk conditions, work with hepatitis A-infected animals, work in hepatitis A research labs, or travel to or work in countries with a high rate of hepatitis A should be immunized. Adults who were previously unvaccinated and who  anticipate close contact with an international adoptee during the first 60 days after arrival in the [place] from a country with a high rate of hepatitis A should be immunized.  Hepatitis B vaccine. Adults who wish to be protected from this disease, have certain high-risk conditions, may be exposed to blood or other infectious body fluids, are household contacts or sex partners of hepatitis B positive people, are clients or workers in certain care facilities, or travel to or work in countries with a high rate of hepatitis B should be immunized.  Haemophilus influenzae type b (Hib) vaccine. A previously unvaccinated person with asplenia or sickle cell disease or having a scheduled splenectomy should receive 1 dose of Hib vaccine. Regardless of previous immunization, a recipient of a hematopoietic stem cell transplant should receive a 3-dose series 6-12 months after her successful transplant. Hib vaccine is not recommended for adults with HIV infection. Preventive Services / Frequency Ages 26 to 86 years  Blood pressure check.** / Every 1 to 2 years.  Lipid and cholesterol check.** / Every 5 years beginning at age 19.  Clinical breast exam.** / Every 3 years for women in their 40s and 81s.  BRCA-related cancer risk assessment.** / For women who have family members with a BRCA-related cancer (breast, ovarian, tubal, or peritoneal cancers).  Pap test.** / Every 2 years from ages 34 through 23. Every 3 years starting at age 68 through age 54 or 37 with a history of 3 consecutive normal Pap tests.  HPV screening.** / Every 3 years from ages 26 through ages 45 to 34 with a history of 3 consecutive normal Pap tests.  Hepatitis C blood test.** / For any individual with known risks for hepatitis C.  Skin self-exam. / Monthly.  Influenza vaccine. / Every year.  Tetanus, diphtheria, and acellular pertussis (Tdap, Td) vaccine.** / Consult your health care provider. Pregnant women should receive 1  dose of Tdap vaccine during each pregnancy. 1 dose of Td every 10 years.  Varicella vaccine.** / Consult your health care provider. Pregnant females who do not have evidence of immunity should receive the first dose after pregnancy.  HPV vaccine. / 3 doses over 6 months, if 83 and younger. The vaccine is not recommended for use in pregnant females. However, pregnancy testing is not needed before receiving a dose.  Measles, mumps, rubella (MMR) vaccine.** / You need at least 1 dose of MMR if you were born in 1957 or later. You may also need a 2nd dose. For females of childbearing age, rubella immunity should be determined. If there is no evidence of immunity, females who are not pregnant should be vaccinated. If there is no evidence of immunity, females who are pregnant should delay immunization until after pregnancy.  Pneumococcal 13-valent conjugate (PCV13) vaccine.** / Consult your health care provider.  Pneumococcal polysaccharide (PPSV23) vaccine.** / 1 to 2 doses  if you smoke cigarettes or if you have certain conditions.  Meningococcal vaccine.** / 1 dose if you are age 77 to 3 years and a Market researcher living in a residence hall, or have one of several medical conditions, you need to get vaccinated against meningococcal disease. You may also need additional booster doses.  Hepatitis A vaccine.** / Consult your health care provider.  Hepatitis B vaccine.** / Consult your health care provider.  Haemophilus influenzae type b (Hib) vaccine.** / Consult your health care provider. Ages 54 to 20 years  Blood pressure check.** / Every 1 to 2 years.  Lipid and cholesterol check.** / Every 5 years beginning at age 70 years.  Lung cancer screening. / Every year if you are aged 90-80 years and have a 30-pack-year history of smoking and currently smoke or have quit within the past 15 years. Yearly screening is stopped once you have quit smoking for at least 15 years or develop a  health problem that would prevent you from having lung cancer treatment.  Clinical breast exam.** / Every year after age 107 years.  BRCA-related cancer risk assessment.** / For women who have family members with a BRCA-related cancer (breast, ovarian, tubal, or peritoneal cancers).  Mammogram.** / Every year beginning at age 25 years and continuing for as long as you are in good health. Consult with your health care provider.  Pap test.** / Every 3 years starting at age 32 years through age 70 or 52 years with a history of 3 consecutive normal Pap tests.  HPV screening.** / Every 3 years from ages 74 years through ages 39 to 82 years with a history of 3 consecutive normal Pap tests.  Fecal occult blood test (FOBT) of stool. / Every year beginning at age 88 years and continuing until age 55 years. You may not need to do this test if you get a colonoscopy every 10 years.  Flexible sigmoidoscopy or colonoscopy.** / Every 5 years for a flexible sigmoidoscopy or every 10 years for a colonoscopy beginning at age 52 years and continuing until age 72 years.  Hepatitis C blood test.** / For all people born from [birth year] through 1967 and any individual with known risks for hepatitis C.  Skin self-exam. / Monthly.  Influenza vaccine. / Every year.  Tetanus, diphtheria, and acellular pertussis (Tdap/Td) vaccine.** / Consult your health care provider. Pregnant women should receive 1 dose of Tdap vaccine during each pregnancy. 1 dose of Td every 10 years.  Varicella vaccine.** / Consult your health care provider. Pregnant females who do not have evidence of immunity should receive the first dose after pregnancy.  Zoster vaccine.** / 1 dose for adults aged 45 years or older.  Measles, mumps, rubella (MMR) vaccine.** / You need at least 1 dose of MMR if you were born in 1957 or later. You may also need a 2nd dose. For females of childbearing age, rubella immunity should be determined. If there is no  evidence of immunity, females who are not pregnant should be vaccinated. If there is no evidence of immunity, females who are pregnant should delay immunization until after pregnancy.  Pneumococcal 13-valent conjugate (PCV13) vaccine.** / Consult your health care provider.  Pneumococcal polysaccharide (PPSV23) vaccine.** / 1 to 2 doses if you smoke cigarettes or if you have certain conditions.  Meningococcal vaccine.** / Consult your health care provider.  Hepatitis A vaccine.** / Consult your health care provider.  Hepatitis B vaccine.** / Consult your health care provider.  Haemophilus influenzae type b (Hib) vaccine.** / Consult your health care provider. Ages 74 years and over  Blood pressure check.** / Every 1 to 2 years.  Lipid and cholesterol check.** / Every 5 years beginning at age 44 years.  Lung cancer screening. / Every year if you are aged 98-80 years and have a 30-pack-year history of smoking and currently smoke or have quit within the past 15 years. Yearly screening is stopped once you have quit smoking for at least 15 years or develop a health problem that would prevent you from having lung cancer treatment.  Clinical breast exam.** / Every year after age 35 years.  BRCA-related cancer risk assessment.** / For women who have family members with a BRCA-related cancer (breast, ovarian, tubal, or peritoneal cancers).  Mammogram.** / Every year beginning at age 32 years and continuing for as long as you are in good health. Consult with your health care provider.  Pap test.** / Every 3 years starting at age 52 years through age 69 or 57 years with 3 consecutive normal Pap tests. Testing can be stopped between 65 and 70 years with 3 consecutive normal Pap tests and no abnormal Pap or HPV tests in the past 10 years.  HPV screening.** / Every 3 years from ages 37 years through ages 80 or 73 years with a history of 3 consecutive normal Pap tests. Testing can be stopped between 65  and 70 years with 3 consecutive normal Pap tests and no abnormal Pap or HPV tests in the past 10 years.  Fecal occult blood test (FOBT) of stool. / Every year beginning at age 45 years and continuing until age 67 years. You may not need to do this test if you get a colonoscopy every 10 years.  Flexible sigmoidoscopy or colonoscopy.** / Every 5 years for a flexible sigmoidoscopy or every 10 years for a colonoscopy beginning at age 46 years and continuing until age 57 years.  Hepatitis C blood test.** / For all people born from [birth year] through 1967 and any individual with known risks for hepatitis C.  Osteoporosis screening.** / A one-time screening for women ages 84 years and over and women at risk for fractures or osteoporosis.  Skin self-exam. / Monthly.  Influenza vaccine. / Every year.  Tetanus, diphtheria, and acellular pertussis (Tdap/Td) vaccine.** / 1 dose of Td every 10 years.  Varicella vaccine.** / Consult your health care provider.  Zoster vaccine.** / 1 dose for adults aged 16 years or older.  Pneumococcal 13-valent conjugate (PCV13) vaccine.** / Consult your health care provider.  Pneumococcal polysaccharide (PPSV23) vaccine.** / 1 dose for all adults aged 66 years and older.  Meningococcal vaccine.** / Consult your health care provider.  Hepatitis A vaccine.** / Consult your health care provider.  Hepatitis B vaccine.** / Consult your health care provider.  Haemophilus influenzae type b (Hib) vaccine.** / Consult your health care provider. ** Family history and personal history of risk and conditions may change your health care provider's recommendations. Document Released: 12/13/2001 Document Revised: 03/03/2014 Document Reviewed: 03/14/2011 [facility] Patient Information 2015 [place], [place]. This information is not intended to replace advice given to you by your health care provider. Make sure you discuss any questions you have with your health care provider.

## 2014-10-21 NOTE — Progress Notes (Signed)
Chief Complaint  Patient presents with  . Annual Exam    HPI: Patient  Danielle Graham  48 y.o. comes in today for Preventive Health Care visit   No major change in health status since last visit . Not exercising now. Time  On med for mild UI  And kegels to do  Has inter numbness lateral lower over fibula at times  crosses legs a lot  No injury  Sees gyne  utd periods   Health Maintenance  Topic Date Due  . MAMMOGRAM  05/08/2015  . INFLUENZA VACCINE  06/01/2015  . PAP SMEAR  08/12/2015  . COLONOSCOPY  10/23/2019  . TETANUS/TDAP  02/28/2021   Health Maintenance Review LIFESTYLE:  Exercise:   No  Tobacco/ETS: no Alcohol:    Weekends  Sugar beverages: no Sleep: about 6-8  Drug use: no   Colonoscopy:  Due dr Collene Mares  PAP: utd  MAMMO: utd    ROS: easy to gain weight GEN/ HEENT: No fever, significant weight changes sweats headaches vision problems hearing changes, CV/ PULM; No chest pain shortness of breath cough, syncope,edema  change in exercise tolerance. GI /GU: No adominal pain, vomiting, change in bowel habits. No blood in the stool. No significant GU symptoms. SKIN/HEME: ,no acute skin rashes suspicious lesions or bleeding. No lymphadenopathy, nodules, masses.  NEURO/ PSYCH:  No neurologic signs such as weakness numbness. No depression anxiety. IMM/ Allergy: No unusual infections.  Allergy .   REST of 12 system review negative except as per HPI   Past Medical History  Diagnosis Date  . Hyperlipidemia   . Allergic rhinitis, seasonal   . Urinary incontinence   . History of colonic polyps     pre cancer  . History of chicken pox   . HSV (herpes simplex virus) infection   . NSVD (normal spontaneous vaginal delivery)     x2  . HERPES GENITALIS 11/06/2009    Qualifier: Diagnosis of  By: Hulan Saas, CMA (AAMA), Quita Skye   . HYPERGLYCEMIA, FASTING 03/22/2010    Qualifier: Diagnosis of  By: Regis Bill MD, Standley Brooking     Past Surgical History  Procedure Laterality Date  .  Tonsillectomy  1971  . Myomectomy    . Breast biopsy  11/1999    right lumpectomy..fibroadenoma  . Dilation and curettage of uterus      miscarriage 1999.Marland Kitchenx2    Family History  Problem Relation Age of Onset  . Asthma Mother   . Colon cancer Father     deceased  . Cancer Father 52    colon  . Heart disease Paternal Grandmother     62 s  . Other      ELEVated WBC count in half sister  . Breast cancer Maternal Aunt     History   Social History  . Marital Status: Married    Spouse Name: N/A    Number of Children: N/A  . Years of Education: N/A   Social History Main Topics  . Smoking status: Former Smoker    Quit date: 09/27/2008  . Smokeless tobacco: Never Used  . Alcohol Use: Yes     Comment: weekend only...3 drinks  . Drug Use: No  . Sexual Activity:    Partners: Male     Comment: PARTNER WITH VASECTOMY   Other Topics Concern  . 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        Has smoke detector and wears seat belts.  No firearms stored safely. . No excess sun exposure. Sees dentist regularly . No depression   G4P2                Outpatient Encounter Prescriptions as of 10/21/2014  Medication Sig  . aspirin 81 MG tablet Take 81 mg by mouth daily.   . calcium carbonate (OS-CAL) 1250 MG chewable tablet Chew 1 tablet by mouth daily.    . fish oil-omega-3 fatty acids 1000 MG capsule Take 2 g by mouth daily.    . mirabegron ER (MYRBETRIQ) 25 MG TB24 tablet Take 1 tablet (25 mg total) by mouth daily.  . MULTIPLE VITAMIN PO Take by mouth.      EXAM:  BP 120/80 mmHg  Temp(Src) 98.5 F (36.9 C) (Oral)  Ht 5' 3.75" (1.619 m)  Wt 143 lb 3.2 oz (64.955 kg)  BMI 24.78 kg/m2  LMP 10/16/2014  Body mass index is 24.78 kg/(m^2).  Physical Exam: Vital signs reviewed ERD:EYCX is a  well-developed well-nourished alert cooperative    who appearsr stated age in no acute distress.  HEENT: normocephalic atraumatic , Eyes: PERRL EOM's full, conjunctiva clear, Nares: paten,t no deformity discharge or tenderness., Ears: no deformity EAC's clear TMs with normal landmarks. Mouth: clear OP, no lesions, edema.  Moist mucous membranes. Dentition in adequate repair. NECK: supple without masses, thyromegaly or bruits. CHEST/PULM:  Clear to auscultation and percussion breath sounds equal no wheeze , rales or rhonchi. No chest wall deformities or tenderness. CV: PMI is nondisplaced, S1 S2 no gallops, murmurs, rubs. Peripheral pulses are full without delay.No JVD .  Breast: normal by inspection . No dimpling, discharge, masses, tenderness or discharge . ABDOMEN: Bowel sounds normal nontender  No guard or rebound, no hepato splenomegal no CVA tenderness.  No hernia. Extremtities:  No clubbing cyanosis or edema, no acute joint swelling or redness no focal atrophy NEURO:  Oriented x3, cranial nerves 3-12 appear to be intact, no obvious focal weakness,gait within normal limits no abnormal reflexes or asymmetrical SKIN: No acute rashes normal turgor, color, no bruising or petechiae. PSYCH: Oriented, good eye contact, no obvious depression anxiety, cognition and judgment appear normal. LN: no cervical axillary inguinal adenopathy  Lab Results  Component Value Date   WBC 7.3 10/15/2014   HGB 13.3 10/15/2014   HCT 40.8 10/15/2014   PLT 305.0 10/15/2014   GLUCOSE 97 10/15/2014   CHOL 233* 10/15/2014   TRIG 67.0 10/15/2014   HDL 60.50 10/15/2014   LDLDIRECT 148.9 11/13/2012   LDLCALC 159* 10/15/2014   ALT 16 10/15/2014   AST 21 10/15/2014   NA 138 10/15/2014   K 4.5 10/15/2014   CL 109 10/15/2014   CREATININE 0.8 10/15/2014   BUN 20 10/15/2014   CO2 23 10/15/2014   TSH 2.48 10/15/2014    ASSESSMENT AND PLAN:  Discussed the following assessment and plan:  Visit for preventive health  examination - utd screening discussed   Hyperlipidemia - Based on ascvd 2013 risk guidelines  10 year risk of events  1.2% to 0.8% Lifetime risk 8 % and 39 % calculated statins not  A benefit at this time   History of colonic polyps  Family history of colon cancer ? Answered about screening and risk benefit  of various tests    Risk more than benefit from statin at this time   lsi indicated  Counseled.  Check at yearly in a year.  Avoid reg leg crossing  Fu if some type of progressive numbness weakness etc  But seems clinicnally insignificant at this time Patient Care Team: Burnis Medin, MD as PCP - General Terrance Mass, MD (Obstetrics and Gynecology) Lavonna Monarch, MD (Dermatology) Juanita Craver, MD as Consulting Physician (Gastroenterology) Patient Instructions   Exam and labs are good today. Cholesterol can be better   Healthy lifestyle includes : At least 150 minutes of exercise weeks  ,  Add resistance training   To  Cardio.  weight at healthy levels, which is usually   BMI 19-25. Avoid trans fats and processed foods;  Increase fresh fruits and veges to 5 servings per day. And avoid sweet beverages including tea and juice. Mediterranean diet with olive oil and nuts have been noted to be heart and brain healthy . Avoid tobacco products . Limit  alcohol to  7 per week for women and 14 servings for men.  Get adequate sleep . Wear seat belts . Don't text and drive .      Why follow it? Research shows. . Those who follow the Mediterranean diet have a reduced risk of heart disease  . The diet is associated with a reduced incidence of Parkinson's and Alzheimer's diseases . People following the diet may have longer life expectancies and lower rates of chronic diseases  . The Dietary Guidelines for Americans recommends the Mediterranean diet as an eating plan to promote health and prevent disease  What Is the Mediterranean Diet?  . Healthy eating plan based on typical foods and  recipes of Mediterranean-style cooking . The diet is primarily a plant based diet; these foods should make up a majority of meals   Starches - Plant based foods should make up a majority of meals - They are an important sources of vitamins, minerals, energy, antioxidants, and fiber - Choose whole grains, foods high in fiber and minimally processed items  - Typical grain sources include wheat, oats, barley, corn, brown rice, bulgar, farro, millet, polenta, couscous  - Various types of beans include chickpeas, lentils, fava beans, black beans, white beans   Fruits  Veggies - Large quantities of antioxidant rich fruits & veggies; 6 or more servings  - Vegetables can be eaten raw or lightly drizzled with oil and cooked  - Vegetables common to the traditional Mediterranean Diet include: artichokes, arugula, beets, broccoli, brussel sprouts, cabbage, carrots, celery, collard greens, cucumbers, eggplant, kale, leeks, lemons, lettuce, mushrooms, okra, onions, peas, peppers, potatoes, pumpkin, radishes, rutabaga, shallots, spinach, sweet potatoes, turnips, zucchini - Fruits common to the Mediterranean Diet include: apples, apricots, avocados, cherries, clementines, dates, figs, grapefruits, grapes, melons, nectarines, oranges, peaches, pears, pomegranates, strawberries, tangerines  Fats - Replace butter and margarine with healthy oils, such as olive oil, canola oil, and tahini  - Limit nuts to no more than a handful a day  - Nuts include walnuts, almonds, pecans, pistachios, pine nuts  - Limit or avoid candied, honey roasted or heavily salted nuts - Olives are central to the [employer] - can be eaten whole or used in a variety of dishes   Meats Protein - Limiting red meat: no more than a few times a month - When eating red meat: choose lean cuts and keep the portion to the size of deck of cards - Eggs: approx.  0 to 4 times a week  - Fish and lean poultry: at least 2 a week  - Healthy protein  sources include, chicken, [place], lean beef, lamb - Increase intake of seafood such as tuna, salmon, trout, mackerel, shrimp, scallops - Avoid or limit high fat processed meats such as sausage and bacon  Dairy - Include moderate amounts of low fat dairy products  - Focus on healthy dairy such as fat free yogurt, skim milk, low or reduced fat cheese - Limit dairy products higher in fat such as whole or 2% milk, cheese, ice cream  Alcohol - Moderate amounts of red wine is ok  - No more than 5 oz daily for women (all ages) and men older than age 5  - No more than 10 oz of wine daily for men younger than 68  Other - Limit sweets and other desserts  - Use herbs and spices instead of salt to flavor foods  - Herbs and spices common to the traditional Mediterranean Diet include: basil, bay leaves, chives, cloves, cumin, fennel, garlic, lavender, marjoram, mint, oregano, parsley, pepper, rosemary, sage, savory, sumac, tarragon, thyme   It's not just a diet, it's a lifestyle:  . The Mediterranean diet includes lifestyle factors typical of those in the region  . Foods, drinks and meals are best eaten with others and savored . Daily physical activity is important for overall good health . This could be strenuous exercise like running and aerobics . This could also be more leisurely activities such as walking, housework, yard-work, or taking the stairs . Moderation is the key; a balanced and healthy diet accommodates most foods and drinks . Consider portion sizes and frequency of consumption of certain foods   Meal Ideas & Options:  . Breakfast:  o Whole wheat toast or whole wheat English muffins with peanut butter & hard boiled egg o Steel cut oats topped with apples & cinnamon and skim milk  o Fresh fruit: banana, strawberries, melon, berries, peaches  o Smoothies: strawberries, bananas, greek yogurt, peanut butter o Low fat greek yogurt with blueberries and granola  o Egg white omelet with  spinach and mushrooms o Breakfast couscous: whole wheat couscous, apricots, skim milk, cranberries  . Sandwiches:  o Hummus and grilled vegetables (peppers, zucchini, squash) on whole wheat bread   o Grilled chicken on whole wheat pita with lettuce, tomatoes, cucumbers or tzatziki  o Tuna salad on whole wheat bread: tuna salad made with greek yogurt, olives, red peppers, capers, green onions o Garlic rosemary lamb pita: lamb sauted with garlic, rosemary, salt & pepper; add lettuce, cucumber, greek yogurt to pita - flavor with lemon juice and black pepper  . Seafood:  o Mediterranean grilled salmon, seasoned with garlic, basil, parsley, lemon juice and black pepper o Shrimp, lemon, and spinach whole-grain pasta salad made with low fat greek yogurt  o Seared scallops with lemon orzo  o Seared tuna steaks seasoned salt, pepper, coriander topped with tomato mixture of olives, tomatoes, olive oil, minced garlic, parsley, green onions and cappers  . Meats:  o Herbed greek chicken salad with kalamata olives, cucumber, feta  o Red bell peppers stuffed with spinach, bulgur, lean ground beef (or lentils) & topped with feta   o Kebabs: skewers of chicken, tomatoes, onions, zucchini, squash  o [place] burgers: made with red onions, mint, dill, lemon juice, feta cheese topped with roasted red peppers . Vegetarian o Cucumber salad: cucumbers, artichoke hearts, celery, red onion, feta cheese, tossed in  olive oil & lemon juice  o Hummus and whole grain pita points with a greek salad (lettuce, tomato, feta, olives, cucumbers, red onion) o Lentil soup with celery, carrots made with vegetable broth, garlic, salt and pepper  o Tabouli salad: parsley, bulgur, mint, scallions, cucumbers, tomato, radishes, lemon juice, olive oil, salt and pepper.       Based on ascvd 2013 risk guidelines  10 year risk of events  1.2% to 0.8%  Lifetime risk 8 % and 39 % calculated statins not  A benefit at this time   Preventive  Care for Adults A healthy lifestyle and preventive care can promote health and wellness. Preventive health guidelines for women include the following key practices.  A routine yearly physical is a good way to check with your health care provider about your health and preventive screening. It is a chance to share any concerns and updates on your health and to receive a thorough exam.  Visit your dentist for a routine exam and preventive care every 6 months. Brush your teeth twice a day and floss once a day. Good oral hygiene prevents tooth decay and gum disease.  The frequency of eye exams is based on your age, health, family medical history, use of contact lenses, and other factors. Follow your health care provider's recommendations for frequency of eye exams.  Eat a healthy diet. Foods like vegetables, fruits, whole grains, low-fat dairy products, and lean protein foods contain the nutrients you need without too many calories. Decrease your intake of foods high in solid fats, added sugars, and salt. Eat the right amount of calories for you.Get information about a proper diet from your health care provider, if necessary.  Regular physical exercise is one of the most important things you can do for your health. Most adults should get at least 150 minutes of moderate-intensity exercise (any activity that increases your heart rate and causes you to sweat) each week. In addition, most adults need muscle-strengthening exercises on 2 or more days a week.  Maintain a healthy weight. The body mass index (BMI) is a screening tool to identify possible weight problems. It provides an estimate of body fat based on height and weight. Your health care provider can find your BMI and can help you achieve or maintain a healthy weight.For adults 20 years and older:  A BMI below 18.5 is considered underweight.  A BMI of 18.5 to 24.9 is normal.  A BMI of 25 to 29.9 is considered overweight.  A BMI of 30 and above  is considered obese.  Maintain normal blood lipids and cholesterol levels by exercising and minimizing your intake of saturated fat. Eat a balanced diet with plenty of fruit and vegetables. Blood tests for lipids and cholesterol should begin at age 45 and be repeated every 5 years. If your lipid or cholesterol levels are high, you are over 50, or you are at high risk for heart disease, you may need your cholesterol levels checked more frequently.Ongoing high lipid and cholesterol levels should be treated with medicines if diet and exercise are not working.  If you smoke, find out from your health care provider how to quit. If you do not use tobacco, do not start.  Lung cancer screening is recommended for adults aged 39-80 years who are at high risk for developing lung cancer because of a history of smoking. A yearly low-dose CT scan of the lungs is recommended for people who have at least a 30-pack-year history of smoking  and are a current smoker or have quit within the past 15 years. A pack year of smoking is smoking an average of 1 pack of cigarettes a day for 1 year (for example: 1 pack a day for 30 years or 2 packs a day for 15 years). Yearly screening should continue until the smoker has stopped smoking for at least 15 years. Yearly screening should be stopped for people who develop a health problem that would prevent them from having lung cancer treatment.  If you are pregnant, do not drink alcohol. If you are breastfeeding, be very cautious about drinking alcohol. If you are not pregnant and choose to drink alcohol, do not have more than 1 drink per day. One drink is considered to be 12 ounces (355 mL) of beer, 5 ounces (148 mL) of wine, or 1.5 ounces (44 mL) of liquor.  Avoid use of street drugs. Do not share needles with anyone. Ask for help if you need support or instructions about stopping the use of drugs.  High blood pressure causes heart disease and increases the risk of stroke. Your blood  pressure should be checked at least every 1 to 2 years. Ongoing high blood pressure should be treated with medicines if weight loss and exercise do not work.  If you are 57-48 years old, ask your health care provider if you should take aspirin to prevent strokes.  Diabetes screening involves taking a blood sample to check your fasting blood sugar level. This should be done once every 3 years, after age 41, if you are within normal weight and without risk factors for diabetes. Testing should be considered at a younger age or be carried out more frequently if you are overweight and have at least 1 risk factor for diabetes.  Breast cancer screening is essential preventive care for women. You should practice "breast self-awareness." This means understanding the normal appearance and feel of your breasts and may include breast self-examination. Any changes detected, no matter how small, should be reported to a health care provider. Women in their 75s and 30s should have a clinical breast exam (CBE) by a health care provider as part of a regular health exam every 1 to 3 years. After age 3, women should have a CBE every year. Starting at age 36, women should consider having a mammogram (breast X-ray test) every year. Women who have a family history of breast cancer should talk to their health care provider about genetic screening. Women at a high risk of breast cancer should talk to their health care providers about having an MRI and a mammogram every year.  Breast cancer gene (BRCA)-related cancer risk assessment is recommended for women who have family members with BRCA-related cancers. BRCA-related cancers include breast, ovarian, tubal, and peritoneal cancers. Having family members with these cancers may be associated with an increased risk for harmful changes (mutations) in the breast cancer genes BRCA1 and BRCA2. Results of the assessment will determine the need for genetic counseling and BRCA1 and BRCA2  testing.  Routine pelvic exams to screen for cancer are no longer recommended for nonpregnant women who are considered low risk for cancer of the pelvic organs (ovaries, uterus, and vagina) and who do not have symptoms. Ask your health care provider if a screening pelvic exam is right for you.  If you have had past treatment for cervical cancer or a condition that could lead to cancer, you need Pap tests and screening for cancer for at least 20 years after  your treatment. If Pap tests have been discontinued, your risk factors (such as having a new sexual partner) need to be reassessed to determine if screening should be resumed. Some women have medical problems that increase the chance of getting cervical cancer. In these cases, your health care provider may recommend more frequent screening and Pap tests.  The HPV test is an additional test that may be used for cervical cancer screening. The HPV test looks for the virus that can cause the cell changes on the cervix. The cells collected during the Pap test can be tested for HPV. The HPV test could be used to screen women aged 69 years and older, and should be used in women of any age who have unclear Pap test results. After the age of 47, women should have HPV testing at the same frequency as a Pap test.  Colorectal cancer can be detected and often prevented. Most routine colorectal cancer screening begins at the age of 18 years and continues through age 43 years. However, your health care provider may recommend screening at an earlier age if you have risk factors for colon cancer. On a yearly basis, your health care provider may provide home test kits to check for hidden blood in the stool. Use of a small camera at the end of a tube, to directly examine the colon (sigmoidoscopy or colonoscopy), can detect the earliest forms of colorectal cancer. Talk to your health care provider about this at age 10, when routine screening begins. Direct exam of the colon  should be repeated every 5-10 years through age 43 years, unless early forms of pre-cancerous polyps or small growths are found.  People who are at an increased risk for hepatitis B should be screened for this virus. You are considered at high risk for hepatitis B if:  You were born in a country where hepatitis B occurs often. Talk with your health care provider about which countries are considered high risk.  Your parents were born in a high-risk country and you have not received a shot to protect against hepatitis B (hepatitis B vaccine).  You have HIV or AIDS.  You use needles to inject street drugs.  You live with, or have sex with, someone who has hepatitis B.  You get hemodialysis treatment.  You take certain medicines for conditions like cancer, organ transplantation, and autoimmune conditions.  Hepatitis C blood testing is recommended for all people born from [birth year] through 1967 and any individual with known risks for hepatitis C.  Practice safe sex. Use condoms and avoid high-risk sexual practices to reduce the spread of sexually transmitted infections (STIs). STIs include gonorrhea, chlamydia, syphilis, trichomonas, herpes, HPV, and human immunodeficiency virus (HIV). Herpes, HIV, and HPV are viral illnesses that have no cure. They can result in disability, cancer, and death.  You should be screened for sexually transmitted illnesses (STIs) including gonorrhea and chlamydia if:  You are sexually active and are younger than 24 years.  You are older than 24 years and your health care provider tells you that you are at risk for this type of infection.  Your sexual activity has changed since you were last screened and you are at an increased risk for chlamydia or gonorrhea. Ask your health care provider if you are at risk.  If you are at risk of being infected with HIV, it is recommended that you take a prescription medicine daily to prevent HIV infection. This is called preexposure  prophylaxis (PrEP). You are considered  at risk if:  You are a heterosexual woman, are sexually active, and are at increased risk for HIV infection.  You take drugs by injection.  You are sexually active with a partner who has HIV.  Talk with your health care provider about whether you are at high risk of being infected with HIV. If you choose to begin PrEP, you should first be tested for HIV. You should then be tested every 3 months for as long as you are taking PrEP.  Osteoporosis is a disease in which the bones lose minerals and strength with aging. This can result in serious bone fractures or breaks. The risk of osteoporosis can be identified using a bone density scan. Women ages 62 years and over and women at risk for fractures or osteoporosis should discuss screening with their health care providers. Ask your health care provider whether you should take a calcium supplement or vitamin D to reduce the rate of osteoporosis.  Menopause can be associated with physical symptoms and risks. Hormone replacement therapy is available to decrease symptoms and risks. You should talk to your health care provider about whether hormone replacement therapy is right for you.  Use sunscreen. Apply sunscreen liberally and repeatedly throughout the day. You should seek shade when your shadow is shorter than you. Protect yourself by wearing long sleeves, pants, a wide-brimmed hat, and sunglasses year round, whenever you are outdoors.  Once a month, do a whole body skin exam, using a mirror to look at the skin on your back. Tell your health care provider of new moles, moles that have irregular borders, moles that are larger than a pencil eraser, or moles that have changed in shape or color.  Stay current with required vaccines (immunizations).  Influenza vaccine. All adults should be immunized every year.  Tetanus, diphtheria, and acellular pertussis (Td, Tdap) vaccine. Pregnant women should receive 1 dose of  Tdap vaccine during each pregnancy. The dose should be obtained regardless of the length of time since the last dose. Immunization is preferred during the 27th-36th week of gestation. An adult who has not previously received Tdap or who does not know her vaccine status should receive 1 dose of Tdap. This initial dose should be followed by tetanus and diphtheria toxoids (Td) booster doses every 10 years. Adults with an unknown or incomplete history of completing a 3-dose immunization series with Td-containing vaccines should begin or complete a primary immunization series including a Tdap dose. Adults should receive a Td booster every 10 years.  Varicella vaccine. An adult without evidence of immunity to varicella should receive 2 doses or a second dose if she has previously received 1 dose. Pregnant females who do not have evidence of immunity should receive the first dose after pregnancy. This first dose should be obtained before leaving the health care facility. The second dose should be obtained 4-8 weeks after the first dose.  Human papillomavirus (HPV) vaccine. Females aged 13-26 years who have not received the vaccine previously should obtain the 3-dose series. The vaccine is not recommended for use in pregnant females. However, pregnancy testing is not needed before receiving a dose. If a female is found to be pregnant after receiving a dose, no treatment is needed. In that case, the remaining doses should be delayed until after the pregnancy. Immunization is recommended for any person with an immunocompromised condition through the age of 43 years if she did not get any or all doses earlier. During the 3-dose series, the second dose  should be obtained 4-8 weeks after the first dose. The third dose should be obtained 24 weeks after the first dose and 16 weeks after the second dose.  Zoster vaccine. One dose is recommended for adults aged 31 years or older unless certain conditions are  present.  Measles, mumps, and rubella (MMR) vaccine. Adults born before [birth year] generally are considered immune to measles and mumps. Adults born in [birth year] or later should have 1 or more doses of MMR vaccine unless there is a contraindication to the vaccine or there is laboratory evidence of immunity to each of the three diseases. A routine second dose of MMR vaccine should be obtained at least 28 days after the first dose for students attending postsecondary schools, health care workers, or international travelers. People who received inactivated measles vaccine or an unknown type of measles vaccine during 1963-1967 should receive 2 doses of MMR vaccine. People who received inactivated mumps vaccine or an unknown type of mumps vaccine before 1979 and are at high risk for mumps infection should consider immunization with 2 doses of MMR vaccine. For females of childbearing age, rubella immunity should be determined. If there is no evidence of immunity, females who are not pregnant should be vaccinated. If there is no evidence of immunity, females who are pregnant should delay immunization until after pregnancy. Unvaccinated health care workers born before [birth year] who lack laboratory evidence of measles, mumps, or rubella immunity or laboratory confirmation of disease should consider measles and mumps immunization with 2 doses of MMR vaccine or rubella immunization with 1 dose of MMR vaccine.  Pneumococcal 13-valent conjugate (PCV13) vaccine. When indicated, a person who is uncertain of her immunization history and has no record of immunization should receive the PCV13 vaccine. An adult aged 4 years or older who has certain medical conditions and has not been previously immunized should receive 1 dose of PCV13 vaccine. This PCV13 should be followed with a dose of pneumococcal polysaccharide (PPSV23) vaccine. The PPSV23 vaccine dose should be obtained at least 8 weeks after the dose of PCV13 vaccine. An adult aged 47  years or older who has certain medical conditions and previously received 1 or more doses of PPSV23 vaccine should receive 1 dose of PCV13. The PCV13 vaccine dose should be obtained 1 or more years after the last PPSV23 vaccine dose.  Pneumococcal polysaccharide (PPSV23) vaccine. When PCV13 is also indicated, PCV13 should be obtained first. All adults aged 77 years and older should be immunized. An adult younger than age 70 years who has certain medical conditions should be immunized. Any person who resides in a nursing home or long-term care facility should be immunized. An adult smoker should be immunized. People with an immunocompromised condition and certain other conditions should receive both PCV13 and PPSV23 vaccines. People with human immunodeficiency virus (HIV) infection should be immunized as soon as possible after diagnosis. Immunization during chemotherapy or radiation therapy should be avoided. Routine use of PPSV23 vaccine is not recommended for American Indians, [address], or people younger than 65 years unless there are medical conditions that require PPSV23 vaccine. When indicated, people who have unknown immunization and have no record of immunization should receive PPSV23 vaccine. One-time revaccination 5 years after the first dose of PPSV23 is recommended for people aged 19-64 years who have chronic kidney failure, nephrotic syndrome, asplenia, or immunocompromised conditions. People who received 1-2 doses of PPSV23 before age 42 years should receive another dose of PPSV23 vaccine at age 52 years or later if  at least 5 years have passed since the previous dose. Doses of PPSV23 are not needed for people immunized with PPSV23 at or after age 3 years.  Meningococcal vaccine. Adults with asplenia or persistent complement component deficiencies should receive 2 doses of quadrivalent meningococcal conjugate (MenACWY-D) vaccine. The doses should be obtained at least 2 months apart.  Microbiologists working with certain meningococcal bacteria, [employer] recruits, people at risk during an outbreak, and people who travel to or live in countries with a high rate of meningitis should be immunized. A first-year college student up through age 52 years who is living in a residence hall should receive a dose if she did not receive a dose on or after her 16th birthday. Adults who have certain high-risk conditions should receive one or more doses of vaccine.  Hepatitis A vaccine. Adults who wish to be protected from this disease, have certain high-risk conditions, work with hepatitis A-infected animals, work in hepatitis A research labs, or travel to or work in countries with a high rate of hepatitis A should be immunized. Adults who were previously unvaccinated and who anticipate close contact with an international adoptee during the first 60 days after arrival in the [place] from a country with a high rate of hepatitis A should be immunized.  Hepatitis B vaccine. Adults who wish to be protected from this disease, have certain high-risk conditions, may be exposed to blood or other infectious body fluids, are household contacts or sex partners of hepatitis B positive people, are clients or workers in certain care facilities, or travel to or work in countries with a high rate of hepatitis B should be immunized.  Haemophilus influenzae type b (Hib) vaccine. A previously unvaccinated person with asplenia or sickle cell disease or having a scheduled splenectomy should receive 1 dose of Hib vaccine. Regardless of previous immunization, a recipient of a hematopoietic stem cell transplant should receive a 3-dose series 6-12 months after her successful transplant. Hib vaccine is not recommended for adults with HIV infection. Preventive Services / Frequency Ages 38 to 69 years  Blood pressure check.** / Every 1 to 2 years.  Lipid and cholesterol check.** / Every 5 years beginning at age  51.  Clinical breast exam.** / Every 3 years for women in their 61s and 61s.  BRCA-related cancer risk assessment.** / For women who have family members with a BRCA-related cancer (breast, ovarian, tubal, or peritoneal cancers).  Pap test.** / Every 2 years from ages 38 through 43. Every 3 years starting at age 40 through age 64 or 34 with a history of 3 consecutive normal Pap tests.  HPV screening.** / Every 3 years from ages 92 through ages 9 to 94 with a history of 3 consecutive normal Pap tests.  Hepatitis C blood test.** / For any individual with known risks for hepatitis C.  Skin self-exam. / Monthly.  Influenza vaccine. / Every year.  Tetanus, diphtheria, and acellular pertussis (Tdap, Td) vaccine.** / Consult your health care provider. Pregnant women should receive 1 dose of Tdap vaccine during each pregnancy. 1 dose of Td every 10 years.  Varicella vaccine.** / Consult your health care provider. Pregnant females who do not have evidence of immunity should receive the first dose after pregnancy.  HPV vaccine. / 3 doses over 6 months, if 72 and younger. The vaccine is not recommended for use in pregnant females. However, pregnancy testing is not needed before receiving a dose.  Measles, mumps, rubella (MMR) vaccine.** / You need  at least 1 dose of MMR if you were born in 1957 or later. You may also need a 2nd dose. For females of childbearing age, rubella immunity should be determined. If there is no evidence of immunity, females who are not pregnant should be vaccinated. If there is no evidence of immunity, females who are pregnant should delay immunization until after pregnancy.  Pneumococcal 13-valent conjugate (PCV13) vaccine.** / Consult your health care provider.  Pneumococcal polysaccharide (PPSV23) vaccine.** / 1 to 2 doses if you smoke cigarettes or if you have certain conditions.  Meningococcal vaccine.** / 1 dose if you are age 64 to 35 years and a [place] living in a residence hall, or have one of several medical conditions, you need to get vaccinated against meningococcal disease. You may also need additional booster doses.  Hepatitis A vaccine.** / Consult your health care provider.  Hepatitis B vaccine.** / Consult your health care provider.  Haemophilus influenzae type b (Hib) vaccine.** / Consult your health care provider. Ages 59 to 3 years  Blood pressure check.** / Every 1 to 2 years.  Lipid and cholesterol check.** / Every 5 years beginning at age 66 years.  Lung cancer screening. / Every year if you are aged 61-80 years and have a 30-pack-year history of smoking and currently smoke or have quit within the past 15 years. Yearly screening is stopped once you have quit smoking for at least 15 years or develop a health problem that would prevent you from having lung cancer treatment.  Clinical breast exam.** / Every year after age 2 years.  BRCA-related cancer risk assessment.** / For women who have family members with a BRCA-related cancer (breast, ovarian, tubal, or peritoneal cancers).  Mammogram.** / Every year beginning at age 84 years and continuing for as long as you are in good health. Consult with your health care provider.  Pap test.** / Every 3 years starting at age 73 years through age 13 or 59 years with a history of 3 consecutive normal Pap tests.  HPV screening.** / Every 3 years from ages 58 years through ages 26 to 40 years with a history of 3 consecutive normal Pap tests.  Fecal occult blood test (FOBT) of stool. / Every year beginning at age 68 years and continuing until age 58 years. You may not need to do this test if you get a colonoscopy every 10 years.  Flexible sigmoidoscopy or colonoscopy.** / Every 5 years for a flexible sigmoidoscopy or every 10 years for a colonoscopy beginning at age 33 years and continuing until age 54 years.  Hepatitis C blood test.** / For all people born from [birth year] through  1965 and any individual with known risks for hepatitis C.  Skin self-exam. / Monthly.  Influenza vaccine. / Every year.  Tetanus, diphtheria, and acellular pertussis (Tdap/Td) vaccine.** / Consult your health care provider. Pregnant women should receive 1 dose of Tdap vaccine during each pregnancy. 1 dose of Td every 10 years.  Varicella vaccine.** / Consult your health care provider. Pregnant females who do not have evidence of immunity should receive the first dose after pregnancy.  Zoster vaccine.** / 1 dose for adults aged 55 years or older.  Measles, mumps, rubella (MMR) vaccine.** / You need at least 1 dose of MMR if you were born in 1957 or later. You may also need a 2nd dose. For females of childbearing age, rubella immunity should be determined. If there is no evidence of immunity, females who  are not pregnant should be vaccinated. If there is no evidence of immunity, females who are pregnant should delay immunization until after pregnancy.  Pneumococcal 13-valent conjugate (PCV13) vaccine.** / Consult your health care provider.  Pneumococcal polysaccharide (PPSV23) vaccine.** / 1 to 2 doses if you smoke cigarettes or if you have certain conditions.  Meningococcal vaccine.** / Consult your health care provider.  Hepatitis A vaccine.** / Consult your health care provider.  Hepatitis B vaccine.** / Consult your health care provider.  Haemophilus influenzae type b (Hib) vaccine.** / Consult your health care provider. Ages 52 years and over  Blood pressure check.** / Every 1 to 2 years.  Lipid and cholesterol check.** / Every 5 years beginning at age 42 years.  Lung cancer screening. / Every year if you are aged 13-80 years and have a 30-pack-year history of smoking and currently smoke or have quit within the past 15 years. Yearly screening is stopped once you have quit smoking for at least 15 years or develop a health problem that would prevent you from having lung cancer  treatment.  Clinical breast exam.** / Every year after age 40 years.  BRCA-related cancer risk assessment.** / For women who have family members with a BRCA-related cancer (breast, ovarian, tubal, or peritoneal cancers).  Mammogram.** / Every year beginning at age 36 years and continuing for as long as you are in good health. Consult with your health care provider.  Pap test.** / Every 3 years starting at age 66 years through age 33 or 26 years with 3 consecutive normal Pap tests. Testing can be stopped between 65 and 70 years with 3 consecutive normal Pap tests and no abnormal Pap or HPV tests in the past 10 years.  HPV screening.** / Every 3 years from ages 42 years through ages 107 or 41 years with a history of 3 consecutive normal Pap tests. Testing can be stopped between 65 and 70 years with 3 consecutive normal Pap tests and no abnormal Pap or HPV tests in the past 10 years.  Fecal occult blood test (FOBT) of stool. / Every year beginning at age 56 years and continuing until age 5 years. You may not need to do this test if you get a colonoscopy every 10 years.  Flexible sigmoidoscopy or colonoscopy.** / Every 5 years for a flexible sigmoidoscopy or every 10 years for a colonoscopy beginning at age 60 years and continuing until age 9 years.  Hepatitis C blood test.** / For all people born from [birth year] through 1967 and any individual with known risks for hepatitis C.  Osteoporosis screening.** / A one-time screening for women ages 33 years and over and women at risk for fractures or osteoporosis.  Skin self-exam. / Monthly.  Influenza vaccine. / Every year.  Tetanus, diphtheria, and acellular pertussis (Tdap/Td) vaccine.** / 1 dose of Td every 10 years.  Varicella vaccine.** / Consult your health care provider.  Zoster vaccine.** / 1 dose for adults aged 79 years or older.  Pneumococcal 13-valent conjugate (PCV13) vaccine.** / Consult your health care provider.  Pneumococcal  polysaccharide (PPSV23) vaccine.** / 1 dose for all adults aged 39 years and older.  Meningococcal vaccine.** / Consult your health care provider.  Hepatitis A vaccine.** / Consult your health care provider.  Hepatitis B vaccine.** / Consult your health care provider.  Haemophilus influenzae type b (Hib) vaccine.** / Consult your health care provider. ** Family history and personal history of risk and conditions may change your health care provider's  recommendations. Document Released: 12/13/2001 Document Revised: 03/03/2014 Document Reviewed: 03/14/2011 [facility] Patient Information 2015 [place], [place]. This information is not intended to replace advice given to you by your health care provider. Make sure you discuss any questions you have with your health care provider.     Standley Brooking. Panosh M.D.

## 2014-10-22 ENCOUNTER — Encounter: Payer: BC Managed Care – PPO | Admitting: Internal Medicine

## 2014-12-04 ENCOUNTER — Other Ambulatory Visit: Payer: Self-pay | Admitting: Gynecology

## 2014-12-04 ENCOUNTER — Telehealth: Payer: Self-pay | Admitting: *Deleted

## 2014-12-04 MED ORDER — TOLTERODINE TARTRATE ER 2 MG PO CP24: 2.0000 mg | ORAL_CAPSULE | Freq: Every day | ORAL | Status: DC

## 2014-12-04 NOTE — Telephone Encounter (Signed)
Pt was prescribed Myrebetriq ER 25 mg tablet on OV 08/11/14 this drug is not covered. Pt will need another Rx please advise

## 2014-12-04 NOTE — Telephone Encounter (Signed)
I put in an order for Detrol LA 2 mg for her to take 1 by mouth daily it appears that her insurance company will cover this.

## 2014-12-05 NOTE — Telephone Encounter (Signed)
Pt informed this has been sent.

## 2014-12-26 ENCOUNTER — Encounter: Payer: Self-pay | Admitting: Family Medicine

## 2014-12-26 LAB — HM COLONOSCOPY

## 2015-01-13 ENCOUNTER — Encounter: Payer: Self-pay | Admitting: Gynecology

## 2015-02-02 ENCOUNTER — Telehealth: Payer: Self-pay | Admitting: *Deleted

## 2015-02-02 NOTE — Telephone Encounter (Signed)
Pt called requesting name of counselor I gave her Berniece AndreasJulie Whitt name and # to call.

## 2015-02-17 ENCOUNTER — Ambulatory Visit (INDEPENDENT_AMBULATORY_CARE_PROVIDER_SITE_OTHER): Payer: 59 | Admitting: Licensed Clinical Social Worker

## 2015-02-17 DIAGNOSIS — F4321 Adjustment disorder with depressed mood: Secondary | ICD-10-CM | POA: Diagnosis not present

## 2015-03-10 ENCOUNTER — Ambulatory Visit (INDEPENDENT_AMBULATORY_CARE_PROVIDER_SITE_OTHER): Payer: 59 | Admitting: Licensed Clinical Social Worker

## 2015-03-10 DIAGNOSIS — F4321 Adjustment disorder with depressed mood: Secondary | ICD-10-CM

## 2015-04-07 ENCOUNTER — Ambulatory Visit: Payer: 59 | Admitting: Licensed Clinical Social Worker

## 2015-05-27 ENCOUNTER — Encounter: Payer: Self-pay | Admitting: Gynecology

## 2015-08-13 ENCOUNTER — Encounter: Payer: BC Managed Care – PPO | Admitting: Gynecology

## 2015-09-14 ENCOUNTER — Encounter: Payer: Self-pay | Admitting: Gynecology

## 2015-09-14 ENCOUNTER — Other Ambulatory Visit (HOSPITAL_COMMUNITY)
Admission: RE | Admit: 2015-09-14 | Discharge: 2015-09-14 | Disposition: A | Discharge status: Home / Self Care | Payer: 59 | Source: Ambulatory Visit | Attending: Gynecology | Admitting: Gynecology

## 2015-09-14 ENCOUNTER — Ambulatory Visit (INDEPENDENT_AMBULATORY_CARE_PROVIDER_SITE_OTHER): Payer: 59 | Admitting: Gynecology

## 2015-09-14 VITALS — BP 128/76 | Ht 64.0 in | Wt 143.6 lb

## 2015-09-14 DIAGNOSIS — Z01419 Encounter for gynecological examination (general) (routine) without abnormal findings: Secondary | ICD-10-CM | POA: Insufficient documentation

## 2015-09-14 DIAGNOSIS — N3281 Overactive bladder: Secondary | ICD-10-CM | POA: Diagnosis not present

## 2015-09-14 DIAGNOSIS — H04123 Dry eye syndrome of bilateral lacrimal glands: Secondary | ICD-10-CM

## 2015-09-14 DIAGNOSIS — Z800 Family history of malignant neoplasm of digestive organs: Secondary | ICD-10-CM

## 2015-09-14 NOTE — Patient Instructions (Signed)
Mirabegron extended-release tablets What is this medicine? MIRABEGRON (MIR a BEG ron) is used to treat overactive bladder. This medicine reduces the amount of bathroom visits. It may also help to control wetting accidents. This medicine may be used for other purposes; ask your health care provider or pharmacist if you have questions. What should I tell my health care provider before I take this medicine? They need to know if you have any of these conditions: -difficulty passing urine -high blood pressure -kidney disease -liver disease -an unusual or allergic reaction to mirabegron, other medicines, foods, dyes, or preservatives -pregnant or trying to get pregnant -breast-feeding How should I use this medicine? Take this medicine by mouth with a glass of water. Follow the directions on the prescription label. Do not cut, crush or chew this medicine. You can take it with or without food. If it upsets your stomach, take it with food. Take your medicine at regular intervals. Do not take it more often than directed. Do not stop taking except on your doctor's advice. Talk to your pediatrician regarding the use of this medicine in children. Special care may be needed. Overdosage: If you think you have taken too much of this medicine contact a poison control center or emergency room at once. NOTE: This medicine is only for you. Do not share this medicine with others. What if I miss a dose? If you miss a dose, take it as soon as you can. If it is almost time for your next dose, take only that dose. Do not take double or extra doses. What may interact with this medicine? -certain medicines for bladder problems like fesoterodine, oxybutynin, solifenacin, tolterodine -desipramine -digoxin -flecainide -ketoconazole -MAOIs like Carbex, Eldepryl, Marplan, Nardil, and Parnate -metoprolol -propafenone -thioridazine -warfarin This list may not describe all possible interactions. Give your health care  provider a list of all the medicines, herbs, non-prescription drugs, or dietary supplements you use. Also tell them if you smoke, drink alcohol, or use illegal drugs. Some items may interact with your medicine. What should I watch for while using this medicine? It may take 8 weeks to notice the full benefit from this medicine. You may need to limit your intake tea, coffee, caffeinated sodas, and alcohol. These drinks may make your symptoms worse. Visit your doctor or health care professional for regular checks on your progress. Check your blood pressure as directed. Ask your doctor or health care professional what your blood pressure should be and when you should contact him or her. What side effects may I notice from receiving this medicine? Side effects that you should report to your doctor or health care professional as soon as possible: -allergic reactions like skin rash, itching or hives, swelling of the face, lips, or tongue -chest pain or palpitations -severe or sudden headache -high blood pressure -fast, irregular heartbeat -redness, blistering, peeling or loosening of the skin, including inside the mouth -signs of infection like fever or chills; cough; sore throat; pain or difficulty passing urine -trouble passing urine or change in the amount of urine Side effects that usually do not require medical attention (Report these to your doctor or health care professional if they continue or are bothersome.): -constipation -diarrhea -dizziness -dry eyes -joint pain -mild headache -nausea -runny nose This list may not describe all possible side effects. Call your doctor for medical advice about side effects. You may report side effects to FDA at 1-800-FDA-1088. Where should I keep my medicine? Keep out of the reach of children. Store   at room temperature between 15 and 30 degrees C (59 and 86 degrees F). Throw away any unused medicine after the expiration date. NOTE: This sheet is a  summary. It may not cover all possible information. If you have questions about this medicine, talk to your doctor, pharmacist, or health care provider.    2016, [employer]. (2015-06-18 10:22:20)  

## 2015-09-14 NOTE — Progress Notes (Signed)
Danielle Graham Aug 29, 1966 [id number]   History:    49 y.o.  for annual gyn exam who has history of overactive bladder and urgency incontinence had been on Myrbetriq in the past and her employer change insurance companies and they switched her to Detrol which is contributing to her complaint today of dry eyes. Review of patient's records indicated back in 2006 she had been referred to Dr. Ihor Gully at [facility] urology for possible consideration of suburethral sling. She has hesitated. Had also had recommended that she obtain a voiding diary for 30 days and she is not done so. Also we have given her instructions on Kegel exercises. Patient's primary physician is Dr.Panosh who has done all patient's lab work in January of this year.  Patient denies any past history of abnormal Pap smears. Her husband has had a vasectomy. She is having normal menstrual cycles. She had her Tdap vaccine in 2008. Patient had a colonoscopy in February this year and benign colon polyps were removed. Her father died of colon cancer at the age of 80   Review of her record indicated that in 2005 she had AGUS (atypical glandular cells of undetermined significance) and had a negative cervical conization and subsequent Pap smears have all been negative. She had her mammogram early today results pending at time of this dictation.   Past medical history,surgical history, family history and social history were all reviewed and documented in the EPIC chart.  Gynecologic History Patient's last menstrual period was 09/08/2015. Contraception: vasectomy Last Pap: 2015lts were: normal Last mammogram: 2016lts were: normal  Obstetric History OB History  Gravida Para Term Preterm AB SAB TAB Ectopic Multiple Living  # Outcome Date GA Lbr Len/2nd Weight Sex Delivery Anes PTL Lv  4 SAB           3 SAB           2 Term     M Vag-Spont  N Y  1 Term     F Vag-Spont  N Y       ROS: A ROS was performed and  pertinent positives and negatives are included in the history.  GENERAL: No fevers or chills. HEENT: No change in vision, no earache, sore throat or sinus congestion. NECK: No pain or stiffness. CARDIOVASCULAR: No chest pain or pressure. No palpitations. PULMONARY: No shortness of breath, cough or wheeze. GASTROINTESTINAL: No abdominal pain, nausea, vomiting or diarrhea, melena or bright red blood per rectum. GENITOURINARY: No urinary frequency, urgency, hesitancy or dysuria. MUSCULOSKELETAL: No joint or muscle pain, no back pain, no recent trauma. DERMATOLOGIC: No rash, no itching, no lesions. ENDOCRINE: No polyuria, polydipsia, no heat or cold intolerance. No recent change in weight. HEMATOLOGICAL: No anemia or easy bruising or bleeding. NEUROLOGIC: No headache, seizures, numbness, tingling or weakness. PSYCHIATRIC: No depression, no loss of interest in normal activity or change in sleep pattern.     Exam: chaperone present  BP 128/76 mmHg  Ht  (1.626 m)  Wt 143 lb 9.6 oz (65.137 kg)  BMI 24.64 kg/m2  LMP 09/08/2015  Body mass index is 24.64 kg/(m^2).  General appearance : Well developed well nourished female. No acute distress HEENT: Eyes: no retinal hemorrhage or exudates,  Neck supple, trachea midline, no carotid bruits, no thyroidmegaly Lungs: Clear to auscultation, no rhonchi or wheezes, or rib retractions  Heart: Regular rate and rhythm, no murmurs or gallops Breast:Examined  in sitting and supine position were symmetrical in appearance, no palpable masses or tenderness,  no skin retraction, no nipple inversion, no nipple discharge, no skin discoloration, no axillary or supraclavicular lymphadenopathy Abdomen: no palpable masses or tenderness, no rebound or guarding Extremities: no edema or skin discoloration or tenderness  Pelvic:  Bartholin, Urethra, Skene Glands: Within normal limits             Vagina: No gross lesions or discharge  Cervix: No gross lesions or  discharge  Uterus  antevertedmal size, shape and consistency, non-tender and mobile  Adnexa  Without masses or tenderness  Anus and perineum  normal   Rectovaginal  normal sphincter tone without palpated masses or tenderness             Hemoccult colonoscopy recently     Assessment/Plan:  49 y.o. female for annual exam who has complaining of dry eyes since she has been on Detrol (anti-cholinergic) to the point that her ophthalmologist put her on Restasis. I have asked her to take the medication every other day. We are going to write a letter to her insurance company to cover for her Myrbetriq since she did not have side effects and help with her urgency incontinence and overactive bladder and nocturia. Her Pap smear was done today. Her PCP has been doing her blood work. Vaccine up-to-date. Patient still having normal menstrual cycles at the age of 49 no vasomotor symptoms.    Ok EdwardsFERNANDEZ,JUAN H MD, 5:39 PM 09/14/2015

## 2015-09-15 ENCOUNTER — Telehealth: Payer: Self-pay | Admitting: *Deleted

## 2015-09-15 DIAGNOSIS — R635 Abnormal weight gain: Secondary | ICD-10-CM

## 2015-09-15 NOTE — Telephone Encounter (Signed)
Order placed they will contact pt to schedule. 

## 2015-09-15 NOTE — Telephone Encounter (Signed)
-----   Message from Ok EdwardsJuan H Fernandez, MD sent at 09/14/2015  4:11 PM EST ----- Please make appointment for this patient for  Nutritional  counseling and advise on health eating

## 2015-09-16 LAB — CYTOLOGY - PAP

## 2015-09-22 NOTE — Telephone Encounter (Signed)
Appointment 10/20/15 @ 8:00am

## 2015-10-20 ENCOUNTER — Encounter: Payer: 59 | Attending: Gynecology | Admitting: Skilled Nursing Facility1

## 2015-10-20 ENCOUNTER — Encounter: Payer: Self-pay | Admitting: Skilled Nursing Facility1

## 2015-10-20 VITALS — Ht 64.0 in | Wt 145.0 lb

## 2015-10-20 DIAGNOSIS — E785 Hyperlipidemia, unspecified: Secondary | ICD-10-CM | POA: Insufficient documentation

## 2015-10-20 DIAGNOSIS — Z713 Dietary counseling and surveillance: Secondary | ICD-10-CM | POA: Insufficient documentation

## 2015-10-20 DIAGNOSIS — R635 Abnormal weight gain: Secondary | ICD-10-CM | POA: Diagnosis present

## 2015-10-20 NOTE — Patient Instructions (Signed)
-  Ask your doctor about a barium swallow test-once you have tried the medication -Avoid food that "mooshes" together or try to thin it out -Try three days a week for an aim of 30 minutes (walking) -Try to get up from your desk every 90 minutes -Try to limit your coffee in the morning -Staples: pasta, black beans, onions, canned green beans, canned tuna/salmon, peanut butter, tomato sauce, cheese, rice -Always have fluid with you when you eat -For snack: protein and a carbohydrate OR protein and a vegetable -Plan your meals and snack s for the week

## 2015-10-20 NOTE — Progress Notes (Signed)
  Medical Nutrition Therapy:  Appt start time: 0800 end time:  0900.   Assessment:  Primary concerns today: referred for weight gain. Pt states food sometimes gets caught in her throat doctor states reflex but pt disagrees; happens more often-couple times a month-sticky foods-bread, potatoes, rice-stops eating or drinks something-3-4 times she throws up-food gets stuck in lower throat area-scopy found some inflamation. Pt states she has not tried the medication prescribe for this issue yet.Pt states she is getting older and have been having some wt gain and she does not feel good:how-perceiving own body image and less energy. Pt states she has had High cholesterol since childhood. PT does Not drink enough water according to her purposed amount consumed.  Cholesterol from 2015 233.  Preferred Learning Style:   Auditory  Visual Learning Readiness:   Contemplating  MEDICATIONS: See List   DIETARY INTAKE:  Usual eating pattern includes 3 meals and 0 snacks per day.  Everyday foods include none stated.  Avoided foods include none stated.    24-hr recall:  B ( AM):  Instant oatmeal Snk ( AM): none L ( PM): beanies and weenies----lean cuisine---eating out Snk ( PM): none D ( PM): eggs with cheese----chicken nuggets----black beans Snk ( PM): none Beverages: 2-4 cups of coffee in the morning, water, diet soda, crystal light, beer/wine  Usual physical activity: ADL's  Estimated energy needs: 1600 calories 180 g carbohydrates 120 g protein 44 g fat  Progress Towards Goal(s):  In progress.   Nutritional Diagnosis:  NB-1.1 Food and nutrition-related knowledge deficit As related to no prior nutrition education from a nutrition professional.  As evidenced by pt report and 24 hr recall.    Intervention:  Nutrition counseling for hypercholesterolemia/weight gain. Dietitian educated the pt on cholesterol, balanced meals, eating throughout the day, choking hazards, and physical  activity. Goals: .-Ask your doctor about a barium swallow test-once you have tried the medication -Avoid food that "mooshes" together or try to thin it out -Try three days a week for an aim of 30 minutes (walking) -Try to get up from your desk every 90 minutes -Try to limit your coffee in the morning -Staples: pasta, black beans, onions, canned green beans, canned tuna/salmon, peanut butter, tomato sauce, cheese, rice -Always have fluid with you when you eat -For snack: protein and a carbohydrate OR protein and a vegetable -Plan your meals and snack s for the week  Teaching Method Utilized:  Visual Auditory Handouts given during visit include:  Low sodium flavoring options  Snack sheet  MyPlate  Barriers to learning/adherence to lifestyle change: Lack of planning  Demonstrated degree of understanding via:  Teach Back   Monitoring/Evaluation:  Dietary intake, exercise, and body weight prn.

## 2015-12-07 ENCOUNTER — Other Ambulatory Visit: Payer: Self-pay | Admitting: Gynecology

## 2015-12-10 ENCOUNTER — Other Ambulatory Visit: Payer: Self-pay | Admitting: Gynecology

## 2016-05-13 ENCOUNTER — Other Ambulatory Visit (INDEPENDENT_AMBULATORY_CARE_PROVIDER_SITE_OTHER): Payer: 59

## 2016-05-13 DIAGNOSIS — Z0000 Encounter for general adult medical examination without abnormal findings: Secondary | ICD-10-CM | POA: Diagnosis not present

## 2016-05-13 LAB — CBC WITH DIFFERENTIAL/PLATELET
BASOS PCT: 1.1 % (ref 0.0–3.0)
Basophils Absolute: 0.1 10*3/uL (ref 0.0–0.1)
EOS ABS: 0.3 10*3/uL (ref 0.0–0.7)
EOS PCT: 5.6 % — AB (ref 0.0–5.0)
HCT: 40.3 % (ref 36.0–46.0)
HEMOGLOBIN: 13.4 g/dL (ref 12.0–15.0)
LYMPHS ABS: 1.9 10*3/uL (ref 0.7–4.0)
LYMPHS PCT: 35.3 % (ref 12.0–46.0)
MCHC: 33.2 g/dL (ref 30.0–36.0)
MCV: 90.1 fl (ref 78.0–100.0)
MONO ABS: 0.5 10*3/uL (ref 0.1–1.0)
Monocytes Relative: 9.8 % (ref 3.0–12.0)
NEUTROS PCT: 48.2 % (ref 43.0–77.0)
Neutro Abs: 2.6 10*3/uL (ref 1.4–7.7)
Platelets: 317 10*3/uL (ref 150.0–400.0)
RBC: 4.48 Mil/uL (ref 3.87–5.11)
RDW: 14.4 % (ref 11.5–15.5)
WBC: 5.4 10*3/uL (ref 4.0–10.5)

## 2016-05-13 LAB — LIPID PANEL
CHOLESTEROL: 255 mg/dL — AB (ref 0–200)
HDL: 60.5 mg/dL (ref >39.00)
LDL CALC: 178 mg/dL — AB (ref 0–99)
NonHDL: 194.36
TRIGLYCERIDES: 82 mg/dL (ref 0.0–149.0)
Total CHOL/HDL Ratio: 4
VLDL: 16.4 mg/dL (ref 0.0–40.0)

## 2016-05-13 LAB — HEPATIC FUNCTION PANEL
ALBUMIN: 4.2 g/dL (ref 3.5–5.2)
ALT: 12 U/L (ref 0–35)
AST: 16 U/L (ref 0–37)
Alkaline Phosphatase: 43 U/L (ref 39–117)
BILIRUBIN TOTAL: 0.4 mg/dL (ref 0.2–1.2)
Bilirubin, Direct: 0.1 mg/dL (ref 0.0–0.3)
Total Protein: 6.8 g/dL (ref 6.0–8.3)

## 2016-05-13 LAB — BASIC METABOLIC PANEL
BUN: 12 mg/dL (ref 6–23)
CHLORIDE: 102 meq/L (ref 96–112)
CO2: 26 mEq/L (ref 19–32)
CREATININE: 0.84 mg/dL (ref 0.40–1.20)
Calcium: 9.4 mg/dL (ref 8.4–10.5)
GFR: 75.55 mL/min (ref >60.00)
Glucose, Bld: 106 mg/dL — ABNORMAL HIGH (ref 70–99)
POTASSIUM: 4.5 meq/L (ref 3.5–5.1)
Sodium: 139 mEq/L (ref 135–145)

## 2016-05-13 LAB — TSH: TSH: 2.24 u[IU]/mL (ref 0.35–4.50)

## 2016-05-30 ENCOUNTER — Ambulatory Visit (INDEPENDENT_AMBULATORY_CARE_PROVIDER_SITE_OTHER): Payer: 59 | Admitting: Internal Medicine

## 2016-05-30 ENCOUNTER — Encounter: Payer: Self-pay | Admitting: Internal Medicine

## 2016-05-30 VITALS — BP 120/70 | Temp 98.5°F | Ht 64.0 in | Wt 148.8 lb

## 2016-05-30 DIAGNOSIS — R7301 Impaired fasting glucose: Secondary | ICD-10-CM

## 2016-05-30 DIAGNOSIS — E785 Hyperlipidemia, unspecified: Secondary | ICD-10-CM | POA: Diagnosis not present

## 2016-05-30 DIAGNOSIS — K200 Eosinophilic esophagitis: Secondary | ICD-10-CM

## 2016-05-30 DIAGNOSIS — Z0000 Encounter for general adult medical examination without abnormal findings: Secondary | ICD-10-CM | POA: Diagnosis not present

## 2016-05-30 NOTE — Progress Notes (Signed)
Pre visit review using our clinic review tool, if applicable. No additional management support is needed unless otherwise documented below in the visit note.  Chief Complaint  Patient presents with  . Annual Exam    HPI: Patient  Danielle Graham  50 y.o. comes in today for Preventive Health Care visit  lastpb 2015  Gyne check 11 16    Has eosinophilic esophagitis d after food dysphagia but never took the flovent   Some sx   Now?  Tingling toes in am  No numbness other sx  Motor   On detrol  mybetrec denied from  insurance  Consideration of surgery .   Health Maintenance  Topic Date Due  . Hepatitis C Screening  Jun 16, 1966  . HIV Screening  12/05/1978  . MAMMOGRAM  05/26/2016  . INFLUENZA VACCINE  05/31/2016  . PAP SMEAR  09/13/2016  . TETANUS/TDAP  02/28/2021  . COLONOSCOPY  12/26/2024   Health Maintenance Review LIFESTYLE:  Exercise:  n Tobacco/ETS:n Alcohol: 1-2 and weekends  4-7 per week  Sugar beverages:n Sleep:7 hours Drug use: no went to nutritionist  Very general advise  recently on vacation  hh of 3 daughter off to [place]  Son 13 years    ROS: ocass short lived heart racing   GEN/ HEENT: No fever, significant weight changes sweats headaches vision problems hearing changes, CV/ PULM; No chest pain shortness of breath cough, syncope,edema  change in exercise tolerance. GI /GU: No adominal pain, vomiting, change in bowel habits. No blood in the stool. No significant GU symptoms. SKIN/HEME: ,no acute skin rashes suspicious lesions or bleeding. No lymphadenopathy, nodules, masses.  NEURO/ PSYCH:  No neurologic signs such as weakness numbness.see above No depression anxiety. IMM/ Allergy: No unusual infections.  Allergy .   REST of 12 system review negative except as per HPI   Past Medical History:  Diagnosis Date  . Allergic rhinitis, seasonal   . HERPES GENITALIS 11/06/2009   Qualifier: Diagnosis of  By: Lawernce Ion, CMA (AAMA), Bethann Berkshire   . History of  chicken pox   . History of colonic polyps    pre cancer  . HSV (herpes simplex virus) infection   . HYPERGLYCEMIA, FASTING 03/22/2010   Qualifier: Diagnosis of  By: Fabian Sharp MD, Neta Mends   . Hyperlipidemia   . NSVD (normal spontaneous vaginal delivery)    x2  . Urinary incontinence     Past Surgical History:  Procedure Laterality Date  . BREAST BIOPSY  11/1999   right lumpectomy..fibroadenoma  . DILATION AND CURETTAGE OF UTERUS     miscarriage 1999..x2  . MYOMECTOMY    . TONSILLECTOMY  1971    Family History  Problem Relation Age of Onset  . Asthma Mother   . Colon cancer Father     deceased  . Cancer Father 66    colon  . Heart disease Paternal Grandmother     60 s  . Other      ELEVated WBC count in half sister  . Breast cancer Maternal Aunt     Social History   Social History  . Marital status: Married    Spouse name: N/A  . Number of children: N/A  . Years of education: N/A   Social History Main Topics  . Smoking status: Former Smoker    Quit date: 09/27/2008  . Smokeless tobacco: Never Used  . Alcohol use 0.0 oz/week     Comment: weekend only...3 drinks  . Drug use: No  .  Sexual activity: Yes    Partners: Male     Comment: PARTNER WITH VASECTOMY   Other Topics Concern  . 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        Has smoke detector and wears seat belts.  No firearms stored safely. . No excess sun exposure. Sees dentist regularly . No depression   G4P2                Outpatient Medications Prior to Visit  Medication Sig Dispense Refill  . aspirin 81 MG tablet Take 81 mg by mouth daily.     . calcium carbonate (OS-CAL) 1250 MG chewable tablet Chew 1 tablet by mouth daily.      . fish oil-omega-3 fatty acids 1000 MG capsule Take 2 g by mouth daily.      . MULTIPLE  VITAMIN PO Take by mouth.      . tolterodine (DETROL LA) 2 MG 24 hr capsule TAKE ONE CAPSULE BY MOUTH EVERY DAY 30 capsule 5   No facility-administered medications prior to visit.      EXAM:  BP 120/70 (BP Location: Right Arm, Patient Position: Sitting, Cuff Size: Normal)   Temp 98.5 F (36.9 C) (Oral)   Ht 5\' 4"  (1.626 m)   Wt 148 lb 12.8 oz (67.5 kg)   BMI 25.54 kg/m   Body mass index is 25.54 kg/m.  Physical Exam: Vital signs reviewed VQM:GQQP is a well-developed well-nourished alert cooperative    who appearsr stated age in no acute distress.  HEENT: normocephalic atraumatic , Eyes: PERRL EOM's full, conjunctiva clear, Nares: paten,t no deformity discharge or tenderness., Ears: no deformity EAC's clear TMs with normal landmarks. Mouth: clear OP, no lesions, edema.  Moist mucous membranes. Dentition in adequate repair. NECK: supple without masses, thyromegaly or bruits. CHEST/PULM:  Clear to auscultation and percussion breath sounds equal no wheeze , rales or rhonchi. No chest wall deformities or tenderness. CV: PMI is nondisplaced, S1 S2 no gallops, murmurs, rubs. Peripheral pulses are full without delay.No JVD . Breast: normal by inspection . No dimpling, discharge, masses, tenderness or discharge . ABDOMEN: Bowel sounds normal nontender  No guard or rebound, no hepato splenomegal no CVA tenderness.  No hernia. Extremtities:  No clubbing cyanosis or edema, no acute joint swelling or redness no focal atrophy NEURO:  Oriented x3, cranial nerves 3-12 appear to be intact, no obvious focal weakness,gait within normal limits no abnormal reflexes or asymmetrical SKIN: No acute rashes normal turgor, color, no bruising or petechiae. PSYCH: Oriented, good eye contact, no obvious depression anxiety, cognition and judgment appear normal. LN: no cervical axillary inguinal adenopathy  Lab Results  Component Value Date   WBC 5.4 05/13/2016   HGB 13.4 05/13/2016   HCT 40.3 05/13/2016    PLT 317.0 05/13/2016   GLUCOSE 106 (H) 05/13/2016   CHOL 255 (H) 05/13/2016   TRIG 82.0 05/13/2016   HDL 60.50 05/13/2016   LDLDIRECT 148.9 11/13/2012   LDLCALC 178 (H) 05/13/2016   ALT 12 05/13/2016   AST 16 05/13/2016   NA 139 05/13/2016   K 4.5 05/13/2016   CL 102 05/13/2016   CREATININE 0.84 05/13/2016   BUN 12 05/13/2016   CO2 26  05/13/2016   TSH 2.24 05/13/2016    ASSESSMENT AND PLAN:  Discussed the following assessment and plan:  Visit for preventive health examination  Hyperlipidemia - deteriorated  intesnify lso and fu labs in 4-6 months  Fasting hyperglycemia - lsi check a1c at next lab   Eosinophilic esophagitis - never took the flovent get back with dr Loreta Ave Concern about memory focus  Can add b12 to next  Labs  Add aerobic  exercise if decline or changes call fo fu nuero check  Patient Care Team: Madelin Headings, MD as PCP - General Ok Edwards, MD (Obstetrics and Gynecology) Janalyn Harder, MD (Dermatology) Charna Elizabeth, MD as Consulting Physician (Gastroenterology) Patient Instructions   Intensify lifestyle interventions. As planned   . Aerobic exercise  Can help maintain  Brain health and avoid cognitive decline. FU with Dr Loreta Ave about the Eosinophilic esophagitis  Make appt fasting lab lipid bg and hg a1c  b12   In 4-6 months  Fu if needed .depending on results . We can also do hep c and hiv screen at that time  If all ok the yearly check up      Danielle Graham M.D.

## 2016-05-30 NOTE — Patient Instructions (Addendum)
  Intensify lifestyle interventions. As planned   . Aerobic exercise  Can help maintain  Brain health and avoid cognitive decline. FU with Dr Loreta Ave about the Eosinophilic esophagitis  Make appt fasting lab lipid bg and hg a1c  b12   In 4-6 months  Fu if needed .depending on results . We can also do hep c and hiv screen at that time  If all ok the yearly check up

## 2016-05-31 LAB — HM MAMMOGRAPHY

## 2016-06-01 ENCOUNTER — Encounter: Payer: Self-pay | Admitting: Anesthesiology

## 2016-06-01 ENCOUNTER — Encounter: Payer: Self-pay | Admitting: Family Medicine

## 2016-06-15 ENCOUNTER — Encounter: Payer: Self-pay | Admitting: Gynecology

## 2016-09-14 ENCOUNTER — Ambulatory Visit (INDEPENDENT_AMBULATORY_CARE_PROVIDER_SITE_OTHER): Payer: 59 | Admitting: Gynecology

## 2016-09-14 ENCOUNTER — Encounter: Payer: Self-pay | Admitting: Gynecology

## 2016-09-14 ENCOUNTER — Other Ambulatory Visit (INDEPENDENT_AMBULATORY_CARE_PROVIDER_SITE_OTHER): Payer: 59

## 2016-09-14 VITALS — BP 118/80 | Ht 64.0 in | Wt 144.0 lb

## 2016-09-14 DIAGNOSIS — Z23 Encounter for immunization: Secondary | ICD-10-CM

## 2016-09-14 DIAGNOSIS — Z7289 Other problems related to lifestyle: Secondary | ICD-10-CM

## 2016-09-14 DIAGNOSIS — E785 Hyperlipidemia, unspecified: Secondary | ICD-10-CM

## 2016-09-14 DIAGNOSIS — N393 Stress incontinence (female) (male): Secondary | ICD-10-CM | POA: Diagnosis not present

## 2016-09-14 DIAGNOSIS — E119 Type 2 diabetes mellitus without complications: Secondary | ICD-10-CM

## 2016-09-14 DIAGNOSIS — Z01411 Encounter for gynecological examination (general) (routine) with abnormal findings: Secondary | ICD-10-CM

## 2016-09-14 DIAGNOSIS — N951 Menopausal and female climacteric states: Secondary | ICD-10-CM

## 2016-09-14 DIAGNOSIS — E538 Deficiency of other specified B group vitamins: Secondary | ICD-10-CM

## 2016-09-14 DIAGNOSIS — Z800 Family history of malignant neoplasm of digestive organs: Secondary | ICD-10-CM | POA: Diagnosis not present

## 2016-09-14 LAB — LIPID PANEL
CHOL/HDL RATIO: 4
Cholesterol: 267 mg/dL — ABNORMAL HIGH (ref 0–200)
HDL: 72.1 mg/dL (ref >39.00)
LDL CALC: 174 mg/dL — AB (ref 0–99)
NONHDL: 194.64
Triglycerides: 105 mg/dL (ref 0.0–149.0)
VLDL: 21 mg/dL (ref 0.0–40.0)

## 2016-09-14 LAB — GLUCOSE, RANDOM: GLUCOSE: 102 mg/dL — AB (ref 70–99)

## 2016-09-14 LAB — VITAMIN B12: Vitamin B-12: 445 pg/mL (ref 211–911)

## 2016-09-14 LAB — HEMOGLOBIN A1C: HEMOGLOBIN A1C: 5.6 % (ref 4.6–6.5)

## 2016-09-14 NOTE — Patient Instructions (Addendum)
Kegel Exercises The goal of Kegel exercises is to isolate and exercise your pelvic floor muscles. These muscles act as a hammock that supports the rectum, vagina, small intestine, and uterus. As the muscles weaken, the hammock sags and these organs are displaced from their normal positions. Kegel exercises can strengthen your pelvic floor muscles and help you to improve bladder and bowel control, improve sexual response, and help reduce many problems and some discomfort during pregnancy. Kegel exercises can be done anywhere and at any time. HOW TO PERFORM KEGEL EXERCISES 1. Locate your pelvic floor muscles. To do this, squeeze (contract) the muscles that you use when you try to stop the flow of urine. You will feel a tightness in the vaginal area (women) and a tight lift in the rectal area (men and women). 2. When you begin, contract your pelvic muscles tight for 2-5 seconds, then relax them for 2-5 seconds. This is one set. Do 4-5 sets with a short pause in between. 3. Contract your pelvic muscles for 8-10 seconds, then relax them for 8-10 seconds. Do 4-5 sets. If you cannot contract your pelvic muscles for 8-10 seconds, try 5-7 seconds and work your way up to 8-10 seconds. Your goal is 4-5 sets of 10 contractions each day. Keep your stomach, buttocks, and legs relaxed during the exercises. Perform sets of both short and long contractions. Vary your positions. Perform these contractions 3-4 times per day. Perform sets while you are:   Lying in bed in the morning.  Standing at lunch.  Sitting in the late afternoon.  Lying in bed at night. You should do 40-50 contractions per day. Do not perform more Kegel exercises per day than recommended. Overexercising can cause muscle fatigue. Continue these exercises for for at least 15-20 weeks or as directed by your caregiver. This information is not intended to replace advice given to you by your health care provider. Make sure you discuss any questions you  have with your health care provider. Document Released: 10/03/2012 Document Revised: 11/07/2014 Document Reviewed: 09/06/2015 Elsevier Interactive Patient Education  2017 [employer] Urethral Vaginal Sling A urethral vaginal sling procedure is surgery to correct urinary incontinence. Urinary incontinence is uncontrolled loss of urine. It is common in women who have had children and in older women. In this surgery, a strong piece of material is placed under the tube that drains the bladder (urethra). This sling is made of tension-free vaginal tape or nylon mesh. It fits under the urethra like a hammock. The sling is put in position to straighten, support, and hold the urethra in its normal position. Tell a health care provider about:  Any allergies you have.  All medicines you are taking, including vitamins, herbs, eye drops, creams, and over-the-counter medicines.  Any problems you or family members have had with anesthetic medicines.  Any blood disorders you have.  Any surgeries you have had.  Any medical conditions you have. What are the risks? Generally, this is a safe procedure. However, as with any procedure, complications can occur. Possible complications include:  Infection.  Excessive bleeding.  Damage to other organs.  Problems urinating properly for several days or weeks.  Problems from the use of anesthetics.  Return of the urinary incontinence. What happens before the procedure?  Ask your health care provider about changing or stopping your regular medicines. You may need to stop taking certain medicines 1 week before the surgery.  Do not eat or drink anything for 6-8 hours before the surgery.  If you smoke, do not smoke for at least 2 weeks before the surgery.  Make plans to have someone drive you home after your hospital stay. Also arrange for someone to help you with activities during recovery. What happens during the procedure?  You will have general or  spinal anesthesia. With general anesthesia, you are asleep and will feel no pain. With spinal anesthesia, you are numb from the waist down, but you will still be awake.  A catheter is placed in your bladder to drain urine during the procedure.  An incision is made in your vagina and low on your belly in the hairline.  The sling material is passed around your bladder neck and sutured to the muscles to hold the urethra in its normal position.  The incisions are closed. What happens after the procedure?  You will be taken to a recovery area where your progress will be monitored closely. Your breathing, blood pressure, and pulse (vital signs) will be checked often. When you are stable, you will be moved to a regular hospital room.  You will have a catheter in place to drain your bladder. This will stay in place until your bladder is working properly on its own.  You may have a gauze packing in the vagina to prevent bleeding. This will be removed in 1-2 days.  You will likely need to stay in the hospital for 2-3 days. This information is not intended to replace advice given to you by your health care provider. Make sure you discuss any questions you have with your health care provider. Document Released: 07/26/2008 Document Revised: 03/24/2016 Document Reviewed: 04/05/2013 Elsevier Interactive Patient Education  2017 [employer] Inc[employer]  Menopause and Hormone Replacement Therapy Introduction WHAT IS HORMONE REPLACEMENT THERAPY? Hormone replacement therapy (HRT) is the use of artificial (synthetic) hormones to replace hormones that your body stops producing during menopause. Menopause is the normal time of life when menstrual periods stop completely and the ovaries stop producing the female hormones estrogen and progesterone. This lack of hormones can affect your health and cause undesirable symptoms. HRT can relieve some of those symptoms. WHAT ARE MY OPTIONS FOR HRT? HRT may consist of the  synthetic hormones estrogen and progestin, or it may consist of only estrogen (estrogen-only therapy). You and your health care provider will decide which form of HRT is best for you. If you choose to be on HRT and you have a uterus, estrogen and progestin are usually prescribed. Estrogen-only therapy is used for women who do not have a uterus. Possible options for taking HRT include:  Pills.  Patches.  Gels.  Sprays.  Vaginal cream.  Vaginal rings.  Vaginal inserts. The amount of hormone(s) that you take and how long you take the hormone(s) varies depending on your individual health. It is important to:  Begin HRT with the lowest possible dosage.  Stop HRT as soon as your health care provider tells you to stop.  Work with your health care provider so that you feel informed and comfortable with your decisions. WHAT ARE THE BENEFITS OF HRT? HRT can reduce the frequency and severity of menopausal symptoms. Benefits of HRT vary depending on the menopausal symptoms that you have, the severity of your symptoms, and your overall health. HRT may help to improve the following menopausal symptoms:  Hot flashes and night sweats. These are sudden feelings of heat that spread over the face and body. The skin may turn red, like a blush. Night sweats are hot flashes that  happen while you are sleeping or trying to sleep.  Bone loss (osteoporosis). The body loses calcium more quickly after menopause, causing the bones to become weaker. This can increase the risk for bone breaks (fractures).  Vaginal dryness. The lining of the vagina can become thin and dry, which can cause pain during sexual intercourse or cause infection, burning, or itching.  Urinary tract infections.  Urinary incontinence. This is a decreased ability to control when you urinate.  Irritability.  Short-term memory problems. WHAT ARE THE RISKS OF HRT? Risks of HRT vary depending on your individual health and medical  history. Risks of HRT also depend on whether you receive both estrogen and progestin or you receive estrogen only.HRT may increase the risk of:  Spotting. This is when a small amount of bloodleaks from the vagina unexpectedly.  Endometrial cancer. This cancer is in the lining of the uterus (endometrium).  Breast cancer.  Increased density of breast tissue. This can make it harder to find breast cancer on a breast X-ray (mammogram).  Stroke.  Heart attack.  Blood clots.  Gallbladder disease. Risks of HRT can increase if you have any of the following conditions:  Endometrial cancer.  Liver disease.  Heart disease.  Breast cancer.  History of blood clots.  History of stroke. HOW SHOULD I CARE FOR MYSELF WHILE I AM ON HRT?  Take over-the-counter and prescription medicines only as told by your health care provider.  Get mammograms, pelvic exams, and medical checkups as often as told by your health care provider.  Have Pap tests done as often as told by your health care provider. A Pap test is sometimes called a Pap smear. It is a screening test that is used to check for signs of cancer of the cervix and vagina. A Pap test can also identify the presence of infection or precancerous changes. Pap tests may be done:  Every 3 years, starting at age 52.  Every 5 years, starting after age 52, in combination with testing for human papillomavirus (HPV).  More often or less often depending on other medical conditions you have, your age, and other risk factors.  It is your responsibility to get your Pap test results. Ask your health care provider or the department performing the test when your results will be ready.  Keep all follow-up visits as told by your health care provider. This is important. WHEN SHOULD I SEEK MEDICAL CARE? Talk with your health care provider if:  You have any of these:  Pain or swelling in your legs.  Shortness of breath.  Chest pain.  Lumps or  changes in your breasts or armpits.  Slurred speech.  Pain, burning, or bleeding when you urine.  You develop any of these:  Unusual vaginal bleeding.  Dizziness or headaches.  Weakness or numbness in any part of your arms or legs.  Pain in your abdomen. This information is not intended to replace advice given to you by your health care provider. Make sure you discuss any questions you have with your health care provider. Document Released: 07/16/2003 Document Revised: 03/24/2016 Document Reviewed: 04/20/2015  2017 Elsevier Perimenopause Perimenopause is the time when your body begins to move into the menopause (no menstrual period for 12 straight months). It is a natural process. Perimenopause can begin 2-8 years before the menopause and usually lasts for 1 year after the menopause. During this time, your ovaries may or may not produce an egg. The ovaries vary in their production of estrogen and  progesterone hormones each month. This can cause irregular menstrual periods, difficulty getting pregnant, vaginal bleeding between periods, and uncomfortable symptoms. CAUSES  Irregular production of the ovarian hormones, estrogen and progesterone, and not ovulating every month.  Other causes include:  Tumor of the pituitary gland in the brain.  Medical disease that affects the ovaries.  Radiation treatment.  Chemotherapy.  Unknown causes.  Heavy smoking and excessive alcohol intake can bring on perimenopause sooner. SIGNS AND SYMPTOMS   Hot flashes.  Night sweats.  Irregular menstrual periods.  Decreased sex drive.  Vaginal dryness.  Headaches.  Mood swings.  Depression.  Memory problems.  Irritability.  Tiredness.  Weight gain.  Trouble getting pregnant.  The beginning of losing bone cells (osteoporosis).  The beginning of hardening of the arteries (atherosclerosis). DIAGNOSIS  Your health care provider will make a diagnosis by analyzing your age,  menstrual history, and symptoms. He or she will do a physical exam and note any changes in your body, especially your female organs. Female hormone tests may or may not be helpful depending on the amount of female hormones you produce and when you produce them. However, other hormone tests may be helpful to rule out other problems. TREATMENT  In some cases, no treatment is needed. The decision on whether treatment is necessary during the perimenopause should be made by you and your health care provider based on how the symptoms are affecting you and your lifestyle. Various treatments are available, such as:  Treating individual symptoms with a specific medicine for that symptom.  Herbal medicines that can help specific symptoms.  Counseling.  Group therapy. HOME CARE INSTRUCTIONS   Keep track of your menstrual periods (when they occur, how heavy they are, how long between periods, and how long they last) as well as your symptoms and when they started.  Only take over-the-counter or prescription medicines as directed by your health care provider.  Sleep and rest.  Exercise.  Eat a diet that contains calcium (good for your bones) and soy (acts like the estrogen hormone).  Do not smoke.  Avoid alcoholic beverages.  Take vitamin supplements as recommended by your health care provider. Taking vitamin E may help in certain cases.  Take calcium and vitamin D supplements to help prevent bone loss.  Group therapy is sometimes helpful.  Acupuncture may help in some cases. SEEK MEDICAL CARE IF:   You have questions about any symptoms you are having.  You need a referral to a specialist (gynecologist, psychiatrist, or psychologist). SEEK IMMEDIATE MEDICAL CARE IF:   You have vaginal bleeding.  Your period lasts longer than 8 days.  Your periods are recurring sooner than 21 days.  You have bleeding after intercourse.  You have severe depression.  You have pain when you  urinate.  You have severe headaches.  You have vision problems. This information is not intended to replace advice given to you by your health care provider. Make sure you discuss any questions you have with your health care provider. Document Released: 11/24/2004 Document Revised: 11/07/2014 Document Reviewed: 05/16/2013 Elsevier Interactive Patient Education  2017 [employer].

## 2016-09-15 DIAGNOSIS — Z23 Encounter for immunization: Secondary | ICD-10-CM | POA: Diagnosis not present

## 2016-09-15 LAB — HEPATITIS C ANTIBODY: HCV AB: NEGATIVE

## 2016-09-15 LAB — HIV ANTIBODY (ROUTINE TESTING W REFLEX): HIV: NONREACTIVE

## 2016-09-15 NOTE — Progress Notes (Signed)
Danielle MarekLisa P Graham 02/11/1966 [id number]   History:    50 y.o.  for annual gyn exam who continues to suffer from mixed urinary incontinence (nocturia and stress urinary incontinence) last year she had been prescribed Myrbetriq but her employer change insurance companies and they switched her to [employer] which is contributing to her complaint today of dry eyes. Review of patient's records indicated back in 2006 she had been referred the urologist but she never followed through with the appointment. Her symptoms appears to have worsened. We gave her instructions on Kegel exercises.Patient's primary physician is Dr.Panosh who has done all patient's lab work in January of this year. She had a colonoscopy in 2016 which was normal. She's been screened every 5 years because her father's history of colon cancer.Her Tdap vaccine was completed in 2008.Review of her record indicated that in 2005 she had AGUS (atypical glandular cells of undetermined significance) and had a negative cervical conization and subsequent Pap smears have all been negative. Patient has noted that her patient's of been lighter and some very vague vasomotor symptoms.   medical history,surgical history, family history and social history were all reviewed and documented in the EPIC chart.  Gynecologic History Patient's last menstrual period was 09/03/2016 (approximate). Contraception: vasectomy Last Pap2016. Results were: normal Last mam2017. Results were: normal  Obstetric History OB History  Gravida Para Term Preterm AB Living  4 2 2   2 2   SAB TAB Ectopic Multiple Live Births  2       2    # Outcome Date GA Lbr Len/2nd Weight Sex Delivery Anes PTL Lv  4 SAB           3 SAB           2 Term     M Vag-Spont  N LIV  1 Term     F Vag-Spont  N LIV       ROS: A ROS was performed and pertinent positives and negatives are included in the history.  GENERAL: No fevers or chills. HEENT: No change in vision, no earache, sore throat or  sinus congestion. NECK: No pain or stiffness. CARDIOVASCULAR: No chest pain or pressure. No palpitations. PULMONARY: No shortness of breath, cough or wheeze. GASTROINTESTINAL: No abdominal pain, nausea, vomiting or diarrhea, melena or bright red blood per rectum. GENITOURINARY: No urinary frequency, urgency, hesitancy or dysuria. MUSCULOSKELETAL: No joint or muscle pain, no back pain, no recent trauma. DERMATOLOGIC: No rash, no itching, no lesions. ENDOCRINE: No polyuria, polydipsia, no heat or cold intolerance. No recent change in weight. HEMATOLOGICAL: No anemia or easy bruising or bleeding. NEUROLOGIC: No headache, seizures, numbness, tingling or weakness. PSYCHIATRIC: No depression, no loss of interest in normal activity or change in sleep pattern.     Exam: chaperone present  BP 118/80   Ht 5\' 4"  (1.626 m)   Wt 144 lb (65.3 kg)   LMP 09/03/2016 (Approximate)   BMI 24.72 kg/m   Body mass index is 24.72 kg/m.  General appearance : Well developed well nourished female. No acute distress HEENT: Eyes: no retinal hemorrhage or exudates,  Neck supple, trachea midline, no carotid bruits, no thyroidmegaly Lungs: Clear to auscultation, no rhonchi or wheezes, or rib retractions  Heart: Regular rate and rhythm, no murmurs or gallops Breast:Examined in sitting and supine position were symmetrical in appearance, no palpable masses or tenderness,  no skin retraction, no nipple inversion, no nipple discharge, no skin discoloration, no axillary or supraclavicular lymphadenopathy Abdomen:  no palpable masses or tenderness, no rebound or guarding Extremities: no edema or skin discoloration or tenderness  Pelvic:  Bartholin, Urethra, Skene Glands: Within normal limits             Vagina: No gross lesions or discharge  Cervix: No gross lesions or discharge  UteAnteverted, normal size, shape and consistency, non-tender and mobile  Adnexa  Without masses or tenderness  Anus and perineum  normal    Rectovaginal  normal sphincter tone without palpated masses or tenderness             HemCards will be provided     Assessment/Plan:  50 y.o. female for annual exawill be referred to the urologist for further evaluation of her stress urinary incontinence which I believe she will need a suburethral sling. Because of her cycles getting lighter and some vasomotor symptoms we'll get a baseline FSH today. Her Pap smear was done today. We discussed importance of calcium vitamin D and weightbearing exercises for osteoporosis prevention. Patient received the flu vaccine today. Her PCP has been doing her blood work. Literature information on the menopause was provided.  Ok EdwardsFERNANDEZ,JUAN H MD, 7:36 AM 09/15/2016

## 2016-09-15 NOTE — Addendum Note (Signed)
Addended by: Kem ParkinsonBARNES, Evangelyn Crouse on: 09/15/2016 09:25 AM   Modules accepted: Orders

## 2016-09-17 LAB — FOLLICLE STIMULATING HORMONE: FSH: 12.4 m[IU]/mL

## 2016-12-03 DIAGNOSIS — B349 Viral infection, unspecified: Secondary | ICD-10-CM | POA: Diagnosis not present

## 2016-12-09 ENCOUNTER — Encounter: Payer: Self-pay | Admitting: Gynecology

## 2016-12-09 ENCOUNTER — Other Ambulatory Visit: Payer: Self-pay | Admitting: Gynecology

## 2017-03-15 ENCOUNTER — Encounter: Payer: Self-pay | Admitting: Gynecology

## 2017-03-21 DIAGNOSIS — H1852 Epithelial (juvenile) corneal dystrophy: Secondary | ICD-10-CM | POA: Diagnosis not present

## 2017-05-15 DIAGNOSIS — H11821 Conjunctivochalasis, right eye: Secondary | ICD-10-CM | POA: Diagnosis not present

## 2017-05-15 DIAGNOSIS — H18453 Nodular corneal degeneration, bilateral: Secondary | ICD-10-CM | POA: Diagnosis not present

## 2017-05-15 DIAGNOSIS — H18463 Peripheral corneal degeneration, bilateral: Secondary | ICD-10-CM | POA: Diagnosis not present

## 2017-06-13 DIAGNOSIS — Z1231 Encounter for screening mammogram for malignant neoplasm of breast: Secondary | ICD-10-CM | POA: Diagnosis not present

## 2017-06-13 LAB — HM MAMMOGRAPHY

## 2017-06-14 ENCOUNTER — Encounter: Payer: Self-pay | Admitting: Internal Medicine

## 2017-07-18 DIAGNOSIS — H18459 Nodular corneal degeneration, unspecified eye: Secondary | ICD-10-CM | POA: Diagnosis not present

## 2017-07-18 DIAGNOSIS — H04123 Dry eye syndrome of bilateral lacrimal glands: Secondary | ICD-10-CM | POA: Diagnosis not present

## 2017-07-18 DIAGNOSIS — Z9889 Other specified postprocedural states: Secondary | ICD-10-CM | POA: Diagnosis not present

## 2017-07-21 ENCOUNTER — Encounter: Payer: Self-pay | Admitting: Internal Medicine

## 2017-08-22 NOTE — Progress Notes (Signed)
Chief Complaint  Patient presents with  . Annual Exam    Fasting. No new concerns.     HPI: Patient  Danielle Graham  51 y.o. comes in today for Preventive Health Care visit   Last visit was July 2017 for preventive visit.  Had GYN check with Dr. Toney Rakes in November of last year.  Bladder   Med ? Helps.  Still has problem leaking even when sitting at her desk.   Had skin check  This year  Benign bx  results  Health Maintenance  Topic Date Due  . URINE MICROALBUMIN  12/05/1973  . PAP SMEAR  09/30/2017  . MAMMOGRAM  06/13/2018  . TETANUS/TDAP  02/28/2021  . COLONOSCOPY  12/26/2024  . INFLUENZA VACCINE  Completed  . Hepatitis C Screening  Completed  . HIV Screening  Completed   Health Maintenance Review LIFESTYLE:  Exercise:   Low at this time.  Tobacco/ETS: no  Alcohol:  1 per day  Or qod Sugar beverages:  Coffee Sleep: about 8  Drug use: no HH of  3   No pets  Work:40 hours     ROS:  See hpi GEN/ HEENT: No fever, significant weight changes sweats headaches vision problems hearing changes, CV/ PULM; No chest pain shortness of breath cough, syncope,edema  change in exercise tolerance. GI /GU: No adominal pain, vomiting, change in bowel habits. No blood in the stool. SKIN/HEME: ,no acute skin rashes suspicious lesions or bleeding. No lymphadenopathy, nodules, masses.  NEURO/ PSYCH:  No neurologic signs such as weakness numbness. No depression anxiety.  IMM/ Allergy: No unusual infections.  Allergy .   REST of 12 system review negative except as per HPI   Past Medical History:  Diagnosis Date  . Allergic rhinitis, seasonal   . HERPES GENITALIS 11/06/2009   Qualifier: Diagnosis of  By: Hulan Saas, CMA (AAMA), [place]a Skye   . History of chicken pox   . History of colonic polyps    pre cancer  . HSV (herpes simplex virus) infection   . HYPERGLYCEMIA, FASTING 03/22/2010   Qualifier: Diagnosis of  By: Regis Bill MD, Standley Brooking   . Hyperlipidemia   . NSVD (normal spontaneous  vaginal delivery)    x2  . Overactive bladder   . Urinary incontinence     Past Surgical History:  Procedure Laterality Date  . BREAST BIOPSY  11/1999   right lumpectomy..fibroadenoma  . DILATION AND CURETTAGE OF UTERUS     miscarriage 1999..x2  . MYOMECTOMY    . TONSILLECTOMY  1971    Family History  Problem Relation Age of Onset  . Asthma Mother   . Colon cancer Father        deceased  . Cancer Father 74       colon  . Heart disease Paternal Grandmother        70 s  . Other Unknown        ELEVated WBC count in half sister  . Breast cancer Maternal Aunt     Social History   Social History  . Marital status: Married    Spouse name: N/A  . Number of children: N/A  . Years of education: N/A   Social History Main Topics  . Smoking status: Former Smoker    Quit date: 09/27/2008  . Smokeless tobacco: Never Used  . Alcohol use 0.0 oz/week     Comment: weekend only...3 drinks  . Drug use: No  . Sexual activity: Yes    Partners:  Male     Comment: PARTNER WITH VASECTOMY   Other Topics Concern  . 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        Has smoke detector and wears seat belts.  No firearms stored safely. . No excess sun exposure. Sees dentist regularly . No depression   [id number]                Outpatient Medications Prior to Visit  Medication Sig Dispense Refill  . aspirin 81 MG tablet Take 81 mg by mouth daily.     . calcium carbonate (OS-CAL) 1250 MG chewable tablet Chew 1 tablet by mouth daily.      . fish oil-omega-3 fatty acids 1000 MG capsule Take 2 g by mouth daily.      . MULTIPLE VITAMIN PO Take by mouth.      . tolterodine (DETROL LA) 2 MG 24 hr capsule TAKE ONE CAPSULE BY MOUTH EVERY DAY 30 capsule 11   No facility-administered medications prior to visit.       EXAM:  BP 94/62 (BP Location: Right Arm, Patient Position: Sitting, Cuff Size: Normal)   Pulse 81   Temp 98 F (36.7 C) (Oral)   Ht 5' 3.75" (1.619 m)   Wt 146 lb 8 oz (66.5 kg)   BMI 25.34 kg/m   Body mass index is 25.34 kg/m. Wt Readings from Last 3 Encounters:  08/23/17 146 lb 8 oz (66.5 kg)  09/14/16 144 lb (65.3 kg)  05/30/16 148 lb 12.8 oz (67.5 kg)    Physical Exam: Vital signs reviewed YPP:JKDT is a well-developed well-nourished alert cooperative    who appearsr stated age in no acute distress.  HEENT: normocephalic atraumatic , Eyes: PERRL EOM's full, conjunctiva clear, Nares: paten,t no deformity discharge or tenderness., Ears: no deformity EAC's clear TMs with normal landmarks. Mouth: clear OP, no lesions, edema.  Moist mucous membranes. Dentition in adequate repair. NECK: supple without masses, thyromegaly or bruits. CHEST/PULM:  Clear to auscultation and percussion breath sounds equal no wheeze , rales or rhonchi. No chest wall deformities or tenderness. Breast: to be be by gyne CV: PMI is nondisplaced, S1 S2 no gallops, murmurs, rubs. Peripheral pulses are full without delay.No JVD .  ABDOMEN: Bowel sounds normal nontender  No guard or rebound, no hepato splenomegal no CVA tenderness.  No hernia. Extremtities:  No clubbing cyanosis or edema, no acute joint swelling or redness no focal atrophy NEURO:  Oriented x3, cranial nerves 3-12 appear to be intact, no obvious focal weakness,gait within normal limits no abnormal reflexes or asymmetrical SKIN: No acute rashes normal turgor, color, no bruising or petechiae. PSYCH: Oriented, good eye contact, no obvious depression anxiety, cognition and judgment appear normal. LN: no cervical axillary inguinal adenopathy   BP Readings from Last 3 Encounters:  08/23/17 94/62  09/14/16 118/80  05/30/16 120/70    Lab results reviewed with patient   ASSESSMENT AND PLAN:  Discussed the following assessment and  plan:  Visit for preventive health examination - Plan: Basic metabolic panel, CBC with Differential/Platelet, Hemoglobin A1c, Hepatic function panel, Lipid panel, TSH  Hyperlipidemia, unspecified hyperlipidemia type - Plan: Basic metabolic panel, CBC with Differential/Platelet, Hemoglobin A1c, Hepatic function panel, Lipid  panel, TSH  Fasting hyperglycemia - Plan: Basic metabolic panel, CBC with Differential/Platelet, Hemoglobin A1c, Hepatic function panel, Lipid panel, TSH  Urinary incontinence, unspecified type - Plan: Basic metabolic panel, CBC with Differential/Platelet, Hemoglobin A1c, Hepatic function panel, Lipid panel, TSH, Ambulatory referral to Physical Therapy Discussed physical therapy for UI patient is interested we will do referral and she can follow-up with GYN also. Patient Care Team: Burnis Medin, MD as PCP - General Terrance Mass, MD (Obstetrics and Gynecology) Lavonna Monarch, MD (Dermatology) Juanita Craver, MD as Consulting Physician (Gastroenterology) Patient Instructions  Continued attention to healthy lifestyle may add some exercise does help her energy level walking is good.  You will be contacted about a referral to physical therapy for urinary incontinence.  See if this helps and follow-up with her GYN as planned.  Let you know lab results when back. If all okay yearly checkup appointment.   Preventive Care 40-64 Years, Female Preventive care refers to lifestyle choices and visits with your health care provider that can promote health and wellness. What does preventive care include?  A yearly physical exam. This is also called an annual well check.  Dental exams once or twice a year.  Routine eye exams. Ask your health care provider how often you should have your eyes checked.  Personal lifestyle choices, including: ? Daily care of your teeth and gums. ? Regular physical activity. ? Eating a healthy diet. ? Avoiding tobacco and drug  use. ? Limiting alcohol use. ? Practicing safe sex. ? Taking low-dose aspirin daily starting at age 84. ? Taking vitamin and mineral supplements as recommended by your health care provider. What happens during an annual well check? The services and screenings done by your health care provider during your annual well check will depend on your age, overall health, lifestyle risk factors, and family history of disease. Counseling Your health care provider may ask you questions about your:  Alcohol use.  Tobacco use.  Drug use.  Emotional well-being.  Home and relationship well-being.  Sexual activity.  Eating habits.  Work and work Statistician.  Method of birth control.  Menstrual cycle.  Pregnancy history.  Screening You may have the following tests or measurements:  Height, weight, and BMI.  Blood pressure.  Lipid and cholesterol levels. These may be checked every 5 years, or more frequently if you are over 13 years old.  Skin check.  Lung cancer screening. You may have this screening every year starting at age 36 if you have a 30-pack-year history of smoking and currently smoke or have quit within the past 15 years.  Fecal occult blood test (FOBT) of the stool. You may have this test every year starting at age 2.  Flexible sigmoidoscopy or colonoscopy. You may have a sigmoidoscopy every 5 years or a colonoscopy every 10 years starting at age 65.  Hepatitis C blood test.  Hepatitis B blood test.  Sexually transmitted disease (STD) testing.  Diabetes screening. This is done by checking your blood sugar (glucose) after you have not eaten for a while (fasting). You may have this done every 1-3 years.  Mammogram. This may be done every 1-2 years. Talk to your health care provider about when you should start having regular mammograms. This may depend on whether you have a family history of breast cancer.  BRCA-related cancer screening. This may be done if you  have a family history of breast, ovarian, tubal, or peritoneal cancers.  Pelvic exam and Pap test. This  may be done every 3 years starting at age 19. Starting at age 37, this may be done every 5 years if you have a Pap test in combination with an HPV test.  Bone density scan. This is done to screen for osteoporosis. You may have this scan if you are at high risk for osteoporosis.  Discuss your test results, treatment options, and if necessary, the need for more tests with your health care provider. Vaccines Your health care provider may recommend certain vaccines, such as:  Influenza vaccine. This is recommended every year.  Tetanus, diphtheria, and acellular pertussis (Tdap, Td) vaccine. You may need a Td booster every 10 years.  Varicella vaccine. You may need this if you have not been vaccinated.  Zoster vaccine. You may need this after age 74.  Measles, mumps, and rubella (MMR) vaccine. You may need at least one dose of MMR if you were born in 1957 or later. You may also need a second dose.  Pneumococcal 13-valent conjugate (PCV13) vaccine. You may need this if you have certain conditions and were not previously vaccinated.  Pneumococcal polysaccharide (PPSV23) vaccine. You may need one or two doses if you smoke cigarettes or if you have certain conditions.  Meningococcal vaccine. You may need this if you have certain conditions.  Hepatitis A vaccine. You may need this if you have certain conditions or if you travel or work in places where you may be exposed to hepatitis A.  Hepatitis B vaccine. You may need this if you have certain conditions or if you travel or work in places where you may be exposed to hepatitis B.  Haemophilus influenzae type b (Hib) vaccine. You may need this if you have certain conditions.  Talk to your health care provider about which screenings and vaccines you need and how often you need them. This information is not intended to replace advice given to  you by your health care provider. Make sure you discuss any questions you have with your health care provider. Document Released: 11/13/2015 Document Revised: 07/06/2016 Document Reviewed: 08/18/2015 Elsevier Interactive Patient Education  2017 Crisp K. Panosh M.D.

## 2017-08-23 ENCOUNTER — Ambulatory Visit (INDEPENDENT_AMBULATORY_CARE_PROVIDER_SITE_OTHER): Payer: 59 | Admitting: Internal Medicine

## 2017-08-23 ENCOUNTER — Encounter: Payer: Self-pay | Admitting: Internal Medicine

## 2017-08-23 VITALS — BP 94/62 | HR 81 | Temp 98.0°F | Ht 63.75 in | Wt 146.5 lb

## 2017-08-23 DIAGNOSIS — E785 Hyperlipidemia, unspecified: Secondary | ICD-10-CM

## 2017-08-23 DIAGNOSIS — Z0000 Encounter for general adult medical examination without abnormal findings: Secondary | ICD-10-CM | POA: Diagnosis not present

## 2017-08-23 DIAGNOSIS — R32 Unspecified urinary incontinence: Secondary | ICD-10-CM | POA: Diagnosis not present

## 2017-08-23 DIAGNOSIS — R7301 Impaired fasting glucose: Secondary | ICD-10-CM

## 2017-08-23 LAB — HEPATIC FUNCTION PANEL
ALK PHOS: 50 U/L (ref 39–117)
ALT: 12 U/L (ref 0–35)
AST: 16 U/L (ref 0–37)
Albumin: 4.2 g/dL (ref 3.5–5.2)
BILIRUBIN DIRECT: 0.1 mg/dL (ref 0.0–0.3)
BILIRUBIN TOTAL: 0.6 mg/dL (ref 0.2–1.2)
Total Protein: 6.6 g/dL (ref 6.0–8.3)

## 2017-08-23 LAB — CBC WITH DIFFERENTIAL/PLATELET
BASOS ABS: 0.1 10*3/uL (ref 0.0–0.1)
Basophils Relative: 1 % (ref 0.0–3.0)
EOS ABS: 0.2 10*3/uL (ref 0.0–0.7)
Eosinophils Relative: 4.1 % (ref 0.0–5.0)
HEMATOCRIT: 42.6 % (ref 36.0–46.0)
HEMOGLOBIN: 14 g/dL (ref 12.0–15.0)
LYMPHS PCT: 29 % (ref 12.0–46.0)
Lymphs Abs: 1.6 10*3/uL (ref 0.7–4.0)
MCHC: 32.8 g/dL (ref 30.0–36.0)
MCV: 93.4 fl (ref 78.0–100.0)
MONOS PCT: 9.3 % (ref 3.0–12.0)
Monocytes Absolute: 0.5 10*3/uL (ref 0.1–1.0)
Neutro Abs: 3.2 10*3/uL (ref 1.4–7.7)
Neutrophils Relative %: 56.6 % (ref 43.0–77.0)
Platelets: 339 10*3/uL (ref 150.0–400.0)
RBC: 4.55 Mil/uL (ref 3.87–5.11)
RDW: 15 % (ref 11.5–15.5)
WBC: 5.7 10*3/uL (ref 4.0–10.5)

## 2017-08-23 LAB — BASIC METABOLIC PANEL
BUN: 15 mg/dL (ref 6–23)
CALCIUM: 9.4 mg/dL (ref 8.4–10.5)
CO2: 30 mEq/L (ref 19–32)
CREATININE: 0.71 mg/dL (ref 0.40–1.20)
Chloride: 102 mEq/L (ref 96–112)
GFR: 91.28 mL/min (ref >60.00)
Glucose, Bld: 94 mg/dL (ref 70–99)
Potassium: 4.4 mEq/L (ref 3.5–5.1)
SODIUM: 139 meq/L (ref 135–145)

## 2017-08-23 LAB — LIPID PANEL
CHOL/HDL RATIO: 4
Cholesterol: 277 mg/dL — ABNORMAL HIGH (ref 0–200)
HDL: 74 mg/dL (ref >39.00)
LDL CALC: 175 mg/dL — AB (ref 0–99)
NONHDL: 203.47
TRIGLYCERIDES: 143 mg/dL (ref 0.0–149.0)
VLDL: 28.6 mg/dL (ref 0.0–40.0)

## 2017-08-23 LAB — TSH: TSH: 2.4 u[IU]/mL (ref 0.35–4.50)

## 2017-08-23 LAB — HEMOGLOBIN A1C: Hgb A1c MFr Bld: 5.6 % (ref 4.6–6.5)

## 2017-08-23 NOTE — Patient Instructions (Signed)
Continued attention to healthy lifestyle may add some exercise does help her energy level walking is good.  You will be contacted about a referral to physical therapy for urinary incontinence.  See if this helps and follow-up with her GYN as planned.  Let you know lab results when back. If all okay yearly checkup appointment.   Preventive Care 40-64 Years, Female Preventive care refers to lifestyle choices and visits with your health care provider that can promote health and wellness. What does preventive care include?  A yearly physical exam. This is also called an annual well check.  Dental exams once or twice a year.  Routine eye exams. Ask your health care provider how often you should have your eyes checked.  Personal lifestyle choices, including: ? Daily care of your teeth and gums. ? Regular physical activity. ? Eating a healthy diet. ? Avoiding tobacco and drug use. ? Limiting alcohol use. ? Practicing safe sex. ? Taking low-dose aspirin daily starting at age 74. ? Taking vitamin and mineral supplements as recommended by your health care provider. What happens during an annual well check? The services and screenings done by your health care provider during your annual well check will depend on your age, overall health, lifestyle risk factors, and family history of disease. Counseling Your health care provider may ask you questions about your:  Alcohol use.  Tobacco use.  Drug use.  Emotional well-being.  Home and relationship well-being.  Sexual activity.  Eating habits.  Work and work Statistician.  Method of birth control.  Menstrual cycle.  Pregnancy history.  Screening You may have the following tests or measurements:  Height, weight, and BMI.  Blood pressure.  Lipid and cholesterol levels. These may be checked every 5 years, or more frequently if you are over 55 years old.  Skin check.  Lung cancer screening. You may have this screening  every year starting at age 9 if you have a 30-pack-year history of smoking and currently smoke or have quit within the past 15 years.  Fecal occult blood test (FOBT) of the stool. You may have this test every year starting at age 62.  Flexible sigmoidoscopy or colonoscopy. You may have a sigmoidoscopy every 5 years or a colonoscopy every 10 years starting at age 83.  Hepatitis C blood test.  Hepatitis B blood test.  Sexually transmitted disease (STD) testing.  Diabetes screening. This is done by checking your blood sugar (glucose) after you have not eaten for a while (fasting). You may have this done every 1-3 years.  Mammogram. This may be done every 1-2 years. Talk to your health care provider about when you should start having regular mammograms. This may depend on whether you have a family history of breast cancer.  BRCA-related cancer screening. This may be done if you have a family history of breast, ovarian, tubal, or peritoneal cancers.  Pelvic exam and Pap test. This may be done every 3 years starting at age 58. Starting at age 26, this may be done every 5 years if you have a Pap test in combination with an HPV test.  Bone density scan. This is done to screen for osteoporosis. You may have this scan if you are at high risk for osteoporosis.  Discuss your test results, treatment options, and if necessary, the need for more tests with your health care provider. Vaccines Your health care provider may recommend certain vaccines, such as:  Influenza vaccine. This is recommended every year.  Tetanus, diphtheria,  and acellular pertussis (Tdap, Td) vaccine. You may need a Td booster every 10 years.  Varicella vaccine. You may need this if you have not been vaccinated.  Zoster vaccine. You may need this after age 70.  Measles, mumps, and rubella (MMR) vaccine. You may need at least one dose of MMR if you were born in 1957 or later. You may also need a second dose.  Pneumococcal  13-valent conjugate (PCV13) vaccine. You may need this if you have certain conditions and were not previously vaccinated.  Pneumococcal polysaccharide (PPSV23) vaccine. You may need one or two doses if you smoke cigarettes or if you have certain conditions.  Meningococcal vaccine. You may need this if you have certain conditions.  Hepatitis A vaccine. You may need this if you have certain conditions or if you travel or work in places where you may be exposed to hepatitis A.  Hepatitis B vaccine. You may need this if you have certain conditions or if you travel or work in places where you may be exposed to hepatitis B.  Haemophilus influenzae type b (Hib) vaccine. You may need this if you have certain conditions.  Talk to your health care provider about which screenings and vaccines you need and how often you need them. This information is not intended to replace advice given to you by your health care provider. Make sure you discuss any questions you have with your health care provider. Document Released: 11/13/2015 Document Revised: 07/06/2016 Document Reviewed: 08/18/2015 Elsevier Interactive Patient Education  2017 [employer].

## 2017-09-11 DIAGNOSIS — H04123 Dry eye syndrome of bilateral lacrimal glands: Secondary | ICD-10-CM | POA: Diagnosis not present

## 2017-09-11 DIAGNOSIS — H2511 Age-related nuclear cataract, right eye: Secondary | ICD-10-CM | POA: Diagnosis not present

## 2017-09-11 DIAGNOSIS — H18459 Nodular corneal degeneration, unspecified eye: Secondary | ICD-10-CM | POA: Diagnosis not present

## 2017-09-11 DIAGNOSIS — H18452 Nodular corneal degeneration, left eye: Secondary | ICD-10-CM | POA: Diagnosis not present

## 2017-09-25 ENCOUNTER — Encounter: Payer: Self-pay | Admitting: Obstetrics & Gynecology

## 2017-09-25 ENCOUNTER — Ambulatory Visit (INDEPENDENT_AMBULATORY_CARE_PROVIDER_SITE_OTHER): Payer: 59 | Admitting: Obstetrics & Gynecology

## 2017-09-25 VITALS — BP 114/72 | Ht 64.0 in | Wt 148.0 lb

## 2017-09-25 DIAGNOSIS — Z01419 Encounter for gynecological examination (general) (routine) without abnormal findings: Secondary | ICD-10-CM

## 2017-09-25 DIAGNOSIS — N3946 Mixed incontinence: Secondary | ICD-10-CM

## 2017-09-25 DIAGNOSIS — Z9189 Other specified personal risk factors, not elsewhere classified: Secondary | ICD-10-CM

## 2017-09-25 DIAGNOSIS — Z1151 Encounter for screening for human papillomavirus (HPV): Secondary | ICD-10-CM

## 2017-09-25 NOTE — Patient Instructions (Signed)
1. Encounter for routine gynecological examination with Papanicolaou smear of cervix Normal gynecologic exam.  Pap/HPV HR done.  Breast exam normal.  Mammogram August 2018 benign.  Colonoscopy 2016 normal, will repeat every 5 years due to father with colon cancer.  Fasting health labs with Dr. Regis Bill, last done October 2018.  High LDL, started on a Statin.  2. Relies on partner vasectomy for contraception   3. Mixed stress and urge urinary incontinence No uterine prolapse or cystocele.  On Detrol for urge incontinence which is not controlling completely her urge incontinence.  Recommended to decrease caffeine products and increase water intake.  Also having stress incontinence with coughing and sneezing.  Has not done Kegle exercises optimally, but has tried to without improvement.  Decision to refer to Urology andPT for pelvic floor strengthening.  Danielle Graham, it was a pleasure meeting you today!  I will inform you of your results as soon as available.   Kegel Exercises Kegel exercises help strengthen the muscles that support the rectum, vagina, small intestine, bladder, and uterus. Doing Kegel exercises can help:  Improve bladder and bowel control.  Improve sexual response.  Reduce problems and discomfort during pregnancy.  Kegel exercises involve squeezing your pelvic floor muscles, which are the same muscles you squeeze when you try to stop the flow of urine. The exercises can be done while sitting, standing, or lying down, but it is best to vary your position. Phase 1 exercises 1. Squeeze your pelvic floor muscles tight. You should feel a tight lift in your rectal area. If you are a female, you should also feel a tightness in your vaginal area. Keep your stomach, buttocks, and legs relaxed. 2. Hold the muscles tight for up to 10 seconds. 3. Relax your muscles. Repeat this exercise 50 times a day or as many times as told by your health care provider. Continue to do this exercise for at least  4-6 weeks or for as long as told by your health care provider. This information is not intended to replace advice given to you by your health care provider. Make sure you discuss any questions you have with your health care provider. Document Released: 10/03/2012 Document Revised: 06/11/2016 Document Reviewed: 09/06/2015 Elsevier Interactive Patient Education  2018 [place] Maintenance, Female Adopting a healthy lifestyle and getting preventive care can go a long way to promote health and wellness. Talk with your health care provider about what schedule of regular examinations is right for you. This is a good chance for you to check in with your provider about disease prevention and staying healthy. In between checkups, there are plenty of things you can do on your own. Experts have done a lot of research about which lifestyle changes and preventive measures are most likely to keep you healthy. Ask your health care provider for more information. Weight and diet Eat a healthy diet  Be sure to include plenty of vegetables, fruits, low-fat dairy products, and lean protein.  Do not eat a lot of foods high in solid fats, added sugars, or salt.  Get regular exercise. This is one of the most important things you can do for your health. ? Most adults should exercise for at least 150 minutes each week. The exercise should increase your heart rate and make you sweat (moderate-intensity exercise). ? Most adults should also do strengthening exercises at least twice a week. This is in addition to the moderate-intensity exercise.  Maintain a healthy weight  Body mass index (BMI) is  a measurement that can be used to identify possible weight problems. It estimates body fat based on height and weight. Your health care provider can help determine your BMI and help you achieve or maintain a healthy weight.  For females 1 years of age and older: ? A BMI below 18.5 is considered underweight. ? A BMI  of 18.5 to 24.9 is normal. ? A BMI of 25 to 29.9 is considered overweight. ? A BMI of 30 and above is considered obese.  Watch levels of cholesterol and blood lipids  You should start having your blood tested for lipids and cholesterol at 53 years of age, then have this test every 5 years.  You may need to have your cholesterol levels checked more often if: ? Your lipid or cholesterol levels are high. ? You are older than 53 years of age. ? You are at high risk for heart disease.  Cancer screening Lung Cancer  Lung cancer screening is recommended for adults 104-76 years old who are at high risk for lung cancer because of a history of smoking.  A yearly low-dose CT scan of the lungs is recommended for people who: ? Currently smoke. ? Have quit within the past 15 years. ? Have at least a 30-pack-year history of smoking. A pack year is smoking an average of one pack of cigarettes a day for 1 year.  Yearly screening should continue until it has been 15 years since you quit.  Yearly screening should stop if you develop a health problem that would prevent you from having lung cancer treatment.  Breast Cancer  Practice breast self-awareness. This means understanding how your breasts normally appear and feel.  It also means doing regular breast self-exams. Let your health care provider know about any changes, no matter how small.  If you are in your 20s or 30s, you should have a clinical breast exam (CBE) by a health care provider every 1-3 years as part of a regular health exam.  If you are 35 or older, have a CBE every year. Also consider having a breast X-ray (mammogram) every year.  If you have a family history of breast cancer, talk to your health care provider about genetic screening.  If you are at high risk for breast cancer, talk to your health care provider about having an MRI and a mammogram every year.  Breast cancer gene (BRCA) assessment is recommended for women who have  family members with BRCA-related cancers. BRCA-related cancers include: ? Breast. ? Ovarian. ? Tubal. ? Peritoneal cancers.  Results of the assessment will determine the need for genetic counseling and BRCA1 and BRCA2 testing.  Cervical Cancer Your health care provider may recommend that you be screened regularly for cancer of the pelvic organs (ovaries, uterus, and vagina). This screening involves a pelvic examination, including checking for microscopic changes to the surface of your cervix (Pap test). You may be encouraged to have this screening done every 3 years, beginning at age 59.  For women ages 89-65, health care providers may recommend pelvic exams and Pap testing every 3 years, or they may recommend the Pap and pelvic exam, combined with testing for human papilloma virus (HPV), every 5 years. Some types of HPV increase your risk of cervical cancer. Testing for HPV may also be done on women of any age with unclear Pap test results.  Other health care providers may not recommend any screening for nonpregnant women who are considered low risk for pelvic cancer and who  do not have symptoms. Ask your health care provider if a screening pelvic exam is right for you.  If you have had past treatment for cervical cancer or a condition that could lead to cancer, you need Pap tests and screening for cancer for at least 20 years after your treatment. If Pap tests have been discontinued, your risk factors (such as having a new sexual partner) need to be reassessed to determine if screening should resume. Some women have medical problems that increase the chance of getting cervical cancer. In these cases, your health care provider may recommend more frequent screening and Pap tests.  Colorectal Cancer  This type of cancer can be detected and often prevented.  Routine colorectal cancer screening usually begins at 53 years of age and continues through 53 years of age.  Your health care provider may  recommend screening at an earlier age if you have risk factors for colon cancer.  Your health care provider may also recommend using home test kits to check for hidden blood in the stool.  A small camera at the end of a tube can be used to examine your colon directly (sigmoidoscopy or colonoscopy). This is done to check for the earliest forms of colorectal cancer.  Routine screening usually begins at age 29.  Direct examination of the colon should be repeated every 5-10 years through 53 years of age. However, you may need to be screened more often if early forms of precancerous polyps or small growths are found.  Skin Cancer  Check your skin from head to toe regularly.  Tell your health care provider about any new moles or changes in moles, especially if there is a change in a mole's shape or color.  Also tell your health care provider if you have a mole that is larger than the size of a pencil eraser.  Always use sunscreen. Apply sunscreen liberally and repeatedly throughout the day.  Protect yourself by wearing long sleeves, pants, a wide-brimmed hat, and sunglasses whenever you are outside.  Heart disease, diabetes, and high blood pressure  High blood pressure causes heart disease and increases the risk of stroke. High blood pressure is more likely to develop in: ? People who have blood pressure in the high end of the normal range (130-139/85-89 mm Hg). ? People who are overweight or obese. ? People who are African American.  If you are 56-80 years of age, have your blood pressure checked every 3-5 years. If you are 48 years of age or older, have your blood pressure checked every year. You should have your blood pressure measured twice-once when you are at a hospital or clinic, and once when you are not at a hospital or clinic. Record the average of the two measurements. To check your blood pressure when you are not at a hospital or clinic, you can use: ? An automated blood pressure  machine at a pharmacy. ? A home blood pressure monitor.  If you are between 42 years and 47 years old, ask your health care provider if you should take aspirin to prevent strokes.  Have regular diabetes screenings. This involves taking a blood sample to check your fasting blood sugar level. ? If you are at a normal weight and have a low risk for diabetes, have this test once every three years after 53 years of age. ? If you are overweight and have a high risk for diabetes, consider being tested at a younger age or more often. Preventing infection Hepatitis  B  If you have a higher risk for hepatitis B, you should be screened for this virus. You are considered at high risk for hepatitis B if: ? You were born in a country where hepatitis B is common. Ask your health care provider which countries are considered high risk. ? Your parents were born in a high-risk country, and you have not been immunized against hepatitis B (hepatitis B vaccine). ? You have HIV or AIDS. ? You use needles to inject street drugs. ? You live with someone who has hepatitis B. ? You have had sex with someone who has hepatitis B. ? You get hemodialysis treatment. ? You take certain medicines for conditions, including cancer, organ transplantation, and autoimmune conditions.  Hepatitis C  Blood testing is recommended for: ? Everyone born from [birth year] through 03-02-1966. ? Anyone with known risk factors for hepatitis C.  Sexually transmitted infections (STIs)  You should be screened for sexually transmitted infections (STIs) including gonorrhea and chlamydia if: ? You are sexually active and are younger than 53 years of age. ? You are older than 54 years of age and your health care provider tells you that you are at risk for this type of infection. ? Your sexual activity has changed since you were last screened and you are at an increased risk for chlamydia or gonorrhea. Ask your health care provider if you are at  risk.  If you do not have HIV, but are at risk, it may be recommended that you take a prescription medicine daily to prevent HIV infection. This is called pre-exposure prophylaxis (PrEP). You are considered at risk if: ? You are sexually active and do not regularly use condoms or know the HIV status of your partner(s). ? You take drugs by injection. ? You are sexually active with a partner who has HIV.  Talk with your health care provider about whether you are at high risk of being infected with HIV. If you choose to begin PrEP, you should first be tested for HIV. You should then be tested every 3 months for as long as you are taking PrEP. Pregnancy  If you are premenopausal and you may become pregnant, ask your health care provider about preconception counseling.  If you may become pregnant, take 400 to 800 micrograms (mcg) of folic acid every day.  If you want to prevent pregnancy, talk to your health care provider about birth control (contraception). Osteoporosis and menopause  Osteoporosis is a disease in which the bones lose minerals and strength with aging. This can result in serious bone fractures. Your risk for osteoporosis can be identified using a bone density scan.  If you are 38 years of age or older, or if you are at risk for osteoporosis and fractures, ask your health care provider if you should be screened.  Ask your health care provider whether you should take a calcium or vitamin D supplement to lower your risk for osteoporosis.  Menopause may have certain physical symptoms and risks.  Hormone replacement therapy may reduce some of these symptoms and risks. Talk to your health care provider about whether hormone replacement therapy is right for you. Follow these instructions at home:  Schedule regular health, dental, and eye exams.  Stay current with your immunizations.  Do not use any tobacco products including cigarettes, chewing tobacco, or electronic  cigarettes.  If you are pregnant, do not drink alcohol.  If you are breastfeeding, limit how much and how often you drink alcohol.  Limit alcohol intake to no more than 1 drink per day for nonpregnant women. One drink equals 12 ounces of beer, 5 ounces of wine, or 1 ounces of hard liquor.  Do not use street drugs.  Do not share needles.  Ask your health care provider for help if you need support or information about quitting drugs.  Tell your health care provider if you often feel depressed.  Tell your health care provider if you have ever been abused or do not feel safe at home. This information is not intended to replace advice given to you by your health care provider. Make sure you discuss any questions you have with your health care provider. Document Released: 05/02/2011 Document Revised: 03/24/2016 Document Reviewed: 07/21/2015 Elsevier Interactive Patient Education  [employer].

## 2017-09-25 NOTE — Progress Notes (Signed)
Danielle Graham 04/09/1966 [id number]   History:    51 y.o. [id number] Married  RP:  Established patient presenting for annual gyn exam   HPI:  Mixed stress and urge incontinence.  On Detrol with still some urge incontinence, and c/o dry eyes.  Did some Kegels, but not improved with SUI.  Drinks 4 cups of coffee in am.  Menses regular every month, normal flow.  No pelvic pain.  Breasts wnl.  H/O Conization in 2005, patho no dysplasia/margins neg.  BMs.  Health Labs with Dr Danielle Graham 07/2017.  High LDL, started on Statin.  Past medical history,surgical history, family history and social history were all reviewed and documented in the EPIC chart.  Gynecologic History Patient's last menstrual period was 09/10/2017. Contraception: vasectomy Last Pap: 09/2015. Results were: Negative Last mammogram: 05/2017. Results were: benign Colono 2016, repeat in 5 yrs re Father with Colon Ca  Obstetric History OB History  Gravida Para Term Preterm AB Living  4 2 2   2 2   SAB TAB Ectopic Multiple Live Births  2       2    # Outcome Date GA Lbr Len/2nd Weight Sex Delivery Anes PTL Lv  4 SAB           3 SAB           2 Term     M Vag-Spont  N LIV  1 Term     F Vag-Spont  N LIV       ROS: A ROS was performed and pertinent positives and negatives are included in the history.  GENERAL: No fevers or chills. HEENT: No change in vision, no earache, sore throat or sinus congestion. NECK: No pain or stiffness. CARDIOVASCULAR: No chest pain or pressure. No palpitations. PULMONARY: No shortness of breath, cough or wheeze. GASTROINTESTINAL: No abdominal pain, nausea, vomiting or diarrhea, melena or bright red blood per rectum. GENITOURINARY: No urinary frequency, urgency, hesitancy or dysuria. MUSCULOSKELETAL: No joint or muscle pain, no back pain, no recent trauma. DERMATOLOGIC: No rash, no itching, no lesions. ENDOCRINE: No polyuria, polydipsia, no heat or cold intolerance. No recent change in weight. HEMATOLOGICAL:  No anemia or easy bruising or bleeding. NEUROLOGIC: No headache, seizures, numbness, tingling or weakness. PSYCHIATRIC: No depression, no loss of interest in normal activity or change in sleep pattern.     Exam:   BP 114/72   Ht 5\' 4"  (1.626 m)   Wt 148 lb (67.1 kg)   LMP 09/10/2017 Comment: husband with vasectomy   BMI 25.40 kg/m   Body mass index is 25.4 kg/m.  General appearance : Well developed well nourished female. No acute distress HEENT: Eyes: no retinal hemorrhage or exudates,  Neck supple, trachea midline, no carotid bruits, no thyroidmegaly Lungs: Clear to auscultation, no rhonchi or wheezes, or rib retractions  Heart: Regular rate and rhythm, no murmurs or gallops Breast:Examined in sitting and supine position were symmetrical in appearance, no palpable masses or tenderness,  no skin retraction, no nipple inversion, no nipple discharge, no skin discoloration, no axillary or supraclavicular lymphadenopathy Abdomen: no palpable masses or tenderness, no rebound or guarding Extremities: no edema or skin discoloration or tenderness  Pelvic: Vulva normal  Bartholin, Urethra, Skene Glands: Within normal limits             Vagina: No gross lesions or discharge  Cervix: No gross lesions or discharge.  Pap/HPV HR done.  Uterus  AV, normal size, shape and consistency, non-tender and  mobile  Adnexa  Without masses or tenderness  Anus and perineum  normal   Assessment/Plan:  51 y.o. female for annual exam   1. Encounter for routine gynecological examination with Papanicolaou smear of cervix Normal gynecologic exam.  Pap/HPV HR done.  Breast exam normal.  Mammogram August 2018 benign.  Colonoscopy 2016 normal, will repeat every 5 years due to father with colon cancer.  Fasting health labs with Dr. Fabian Graham, last done October 2018.  High LDL, started on a Statin.  2. Relies on partner vasectomy for contraception   3. Mixed stress and urge urinary incontinence No uterine prolapse  or cystocele.  On Detrol for urge incontinence which is not controlling completely her urge incontinence.  Recommended to decrease caffeine products and increase water intake.  Also having stress incontinence with coughing and sneezing.  Has not done Kegle exercises optimally, but has tried to without improvement.  Decision to refer to Urology andPT for pelvic floor strengthening.   Counseling on above issues more than 50% for 10 minutes  Danielle DelMarie-Lyne Wilburta Milbourn MD, 4:54 PM 09/25/2017

## 2017-09-26 ENCOUNTER — Telehealth: Payer: Self-pay | Admitting: *Deleted

## 2017-09-26 NOTE — Telephone Encounter (Signed)
-----   Message from Genia DelMarie-Lyne Lavoie, MD sent at 09/25/2017  5:07 PM EST ----- Regarding: Mixed stress and urge incontinence Refer to Urologist, Dr Sherron MondayMacdiarmid with Physical Therapy for pelvic floor strenghthening.

## 2017-09-26 NOTE — Telephone Encounter (Signed)
Referral faxed to [facility] they will contact pt to schedule.

## 2017-09-26 NOTE — Addendum Note (Signed)
Addended by: Berna SpareASTILLO, BLANCA A on: 09/26/2017 11:29 AM   Modules accepted: Orders

## 2017-09-29 LAB — PAP, TP IMAGING W/ HPV RNA, RFLX HPV TYPE 16,18/45: HPV DNA High Risk: NOT DETECTED

## 2017-10-04 NOTE — Telephone Encounter (Signed)
Pt scheduled on 11/13/2017 @ 4pm with [name], pt aware.

## 2017-11-13 DIAGNOSIS — M6281 Muscle weakness (generalized): Secondary | ICD-10-CM | POA: Diagnosis not present

## 2017-11-14 DIAGNOSIS — L820 Inflamed seborrheic keratosis: Secondary | ICD-10-CM | POA: Diagnosis not present

## 2017-11-21 DIAGNOSIS — M62838 Other muscle spasm: Secondary | ICD-10-CM | POA: Diagnosis not present

## 2017-11-21 DIAGNOSIS — M6281 Muscle weakness (generalized): Secondary | ICD-10-CM | POA: Diagnosis not present

## 2017-11-21 DIAGNOSIS — M6289 Other specified disorders of muscle: Secondary | ICD-10-CM | POA: Diagnosis not present

## 2018-01-17 DIAGNOSIS — Z9889 Other specified postprocedural states: Secondary | ICD-10-CM | POA: Diagnosis not present

## 2018-01-17 DIAGNOSIS — H11002 Unspecified pterygium of left eye: Secondary | ICD-10-CM | POA: Diagnosis not present

## 2018-01-17 DIAGNOSIS — H18459 Nodular corneal degeneration, unspecified eye: Secondary | ICD-10-CM | POA: Diagnosis not present

## 2018-05-23 ENCOUNTER — Ambulatory Visit (INDEPENDENT_AMBULATORY_CARE_PROVIDER_SITE_OTHER): Payer: 59

## 2018-05-23 ENCOUNTER — Encounter: Payer: Self-pay | Admitting: Internal Medicine

## 2018-05-23 ENCOUNTER — Ambulatory Visit (INDEPENDENT_AMBULATORY_CARE_PROVIDER_SITE_OTHER): Payer: 59 | Admitting: Internal Medicine

## 2018-05-23 VITALS — BP 108/60 | HR 84 | Temp 98.3°F | Wt 138.0 lb

## 2018-05-23 DIAGNOSIS — M25571 Pain in right ankle and joints of right foot: Secondary | ICD-10-CM | POA: Diagnosis not present

## 2018-05-23 DIAGNOSIS — S91011A Laceration without foreign body, right ankle, initial encounter: Secondary | ICD-10-CM

## 2018-05-23 DIAGNOSIS — Z23 Encounter for immunization: Secondary | ICD-10-CM

## 2018-05-23 DIAGNOSIS — S99911A Unspecified injury of right ankle, initial encounter: Secondary | ICD-10-CM | POA: Diagnosis not present

## 2018-05-23 MED ORDER — AMOXICILLIN-POT CLAVULANATE 875-125 MG PO TABS
1.0000 | ORAL_TABLET | Freq: Two times a day (BID) | ORAL | 0 refills | Status: DC
Start: 2018-05-23 — End: 2018-09-11

## 2018-05-23 NOTE — Patient Instructions (Addendum)
No fracture  On x ray   Swelling could be from contusion.  In addition to poss sprain .     Concern about infection from the laceration .   Add antibiotic . Sports medicine.
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
## 2018-05-24 ENCOUNTER — Telehealth: Payer: Self-pay | Admitting: Internal Medicine

## 2018-05-24 NOTE — Telephone Encounter (Signed)
ERROR

## 2018-05-25 ENCOUNTER — Ambulatory Visit (INDEPENDENT_AMBULATORY_CARE_PROVIDER_SITE_OTHER): Payer: 59 | Admitting: Family Medicine

## 2018-05-25 ENCOUNTER — Ambulatory Visit: Payer: Self-pay | Admitting: Family Medicine

## 2018-05-25 ENCOUNTER — Encounter: Payer: Self-pay | Admitting: Family Medicine

## 2018-05-25 VITALS — BP 116/72 | HR 90 | Ht 64.0 in | Wt 138.0 lb

## 2018-05-25 DIAGNOSIS — M25571 Pain in right ankle and joints of right foot: Secondary | ICD-10-CM | POA: Insufficient documentation

## 2018-05-25 NOTE — Assessment & Plan Note (Signed)
X-ray not demonstrating a fracture and ultrasound was unrevealing for any tendon rupture.  Likely a significant contusion. -Continue the ASO brace -Counseled on elevation ice and compression- -follow-up in 2 weeks.
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
## 2018-05-25 NOTE — Patient Instructions (Signed)
Nice to meet you  Please continue the brace  Please try ice and elevation  Please follow up in 2-3 weeks

## 2018-05-30 DIAGNOSIS — M25571 Pain in right ankle and joints of right foot: Secondary | ICD-10-CM | POA: Diagnosis not present

## 2018-06-13 DIAGNOSIS — M25571 Pain in right ankle and joints of right foot: Secondary | ICD-10-CM | POA: Diagnosis not present

## 2018-06-14 ENCOUNTER — Ambulatory Visit: Payer: Self-pay | Admitting: Family Medicine

## 2018-06-28 ENCOUNTER — Encounter: Payer: Self-pay | Admitting: Obstetrics & Gynecology

## 2018-06-28 DIAGNOSIS — Z1231 Encounter for screening mammogram for malignant neoplasm of breast: Secondary | ICD-10-CM | POA: Diagnosis not present

## 2018-06-28 LAB — HM MAMMOGRAPHY

## 2018-07-11 DIAGNOSIS — M25571 Pain in right ankle and joints of right foot: Secondary | ICD-10-CM | POA: Diagnosis not present

## 2018-08-28 ENCOUNTER — Encounter: Payer: Self-pay | Admitting: Internal Medicine

## 2018-09-04 ENCOUNTER — Encounter: Payer: Self-pay | Admitting: Internal Medicine

## 2018-09-05 DIAGNOSIS — H11002 Unspecified pterygium of left eye: Secondary | ICD-10-CM | POA: Diagnosis not present

## 2018-09-05 DIAGNOSIS — Z9889 Other specified postprocedural states: Secondary | ICD-10-CM | POA: Diagnosis not present

## 2018-09-05 DIAGNOSIS — H18459 Nodular corneal degeneration, unspecified eye: Secondary | ICD-10-CM | POA: Diagnosis not present

## 2018-09-11 ENCOUNTER — Encounter: Payer: Self-pay | Admitting: Internal Medicine

## 2018-09-11 ENCOUNTER — Ambulatory Visit (INDEPENDENT_AMBULATORY_CARE_PROVIDER_SITE_OTHER): Payer: 59 | Admitting: Internal Medicine

## 2018-09-11 VITALS — BP 102/62 | HR 58 | Temp 98.1°F | Ht 63.58 in | Wt 131.9 lb

## 2018-09-11 DIAGNOSIS — E785 Hyperlipidemia, unspecified: Secondary | ICD-10-CM | POA: Diagnosis not present

## 2018-09-11 DIAGNOSIS — Z0000 Encounter for general adult medical examination without abnormal findings: Secondary | ICD-10-CM | POA: Diagnosis not present

## 2018-09-11 DIAGNOSIS — S99911S Unspecified injury of right ankle, sequela: Secondary | ICD-10-CM

## 2018-09-11 DIAGNOSIS — N951 Menopausal and female climacteric states: Secondary | ICD-10-CM

## 2018-09-11 DIAGNOSIS — R634 Abnormal weight loss: Secondary | ICD-10-CM

## 2018-09-11 NOTE — Progress Notes (Signed)
Chief Complaint  Patient presents with  . Annual Exam    No new concerns    HPI: Patient  Danielle Graham  52 y.o. comes in today for Preventive Health Care visit  And form  Check ankle   Seen  By Sm and ortho and decided deep bone bruise.  stil feeling pulling.  But nl function  So far LIPIDS:   lsi   Gm had heart in 60s .  Period end may and October .   Some weight loss  ftom stress .   Wonders about menopause irreg menses not a lot of flushes  Stopped the bladder med not that helpful concnetration hard at times  At work to get things done  . Not depr anxiety overwhelming  Health Maintenance  Topic Date Due  . URINE MICROALBUMIN  12/05/1973  . PAP SMEAR  09/26/2018  . MAMMOGRAM  06/29/2019  . COLONOSCOPY  12/26/2024  . TETANUS/TDAP  05/23/2028  . INFLUENZA VACCINE  Completed  . Hepatitis C Screening  Completed  . HIV Screening  Completed   Health Maintenance Review LIFESTYLE:  Exercise:   Yoga  Tobacco/ETS: no Alcohol:  Per week 6  Sugar beverages: no Sleep:  Good  Drug use: no HH of   3   No pets  Work: 40 hours .     ROS:  GEN/ HEENT: No fever, significant weight changes sweats headaches vision problems hearing changes, CV/ PULM; No chest pain shortness of breath cough, syncope,edema  change in exercise tolerance. GI /GU: No adominal pain, vomiting, change in bowel habits. No blood in the stool. No significant GU symptoms. SKIN/HEME: ,no acute skin rashes suspicious lesions or bleeding. No lymphadenopathy, nodules, masses.  NEURO/ PSYCH:  No neurologic signs such as weakness numbness. No depression anxiety. IMM/ Allergy: No unusual infections.  Allergy .   REST of 12 system review negative except as per HPI   Past Medical History:  Diagnosis Date  . Allergic rhinitis, seasonal   . HERPES GENITALIS 11/06/2009   Qualifier: Diagnosis of  By: Hulan Saas, CMA (AAMA), Quita Skye   . History of chicken pox   . History of colonic polyps    pre cancer  . HSV (herpes  simplex virus) infection   . HYPERGLYCEMIA, FASTING 03/22/2010   Qualifier: Diagnosis of  By: Regis Bill MD, Standley Brooking   . Hyperlipidemia   . NSVD (normal spontaneous vaginal delivery)    x2  . Overactive bladder   . Urinary incontinence     Past Surgical History:  Procedure Laterality Date  . BREAST BIOPSY  11/1999   right lumpectomy..fibroadenoma  . DILATION AND CURETTAGE OF UTERUS     miscarriage 1999..x2  . MYOMECTOMY    . REFRACTIVE SURGERY Left   . TONSILLECTOMY  1971    Family History  Problem Relation Age of Onset  . Asthma Mother   . Colon cancer Father        deceased  . Cancer Father 60       colon  . Heart disease Paternal Grandmother        23 s  . Other Unknown        ELEVated WBC count in half sister  . Breast cancer Maternal Aunt     Social History   Socioeconomic History  . Marital status: Married    Spouse name: Not on file  . Number of children: Not on file  . Years of education: Not on file  . Highest  education level: Not on file  Occupational History  . Not on file  Social Needs  . Financial resource strain: Not on file  . Food insecurity:    Worry: Not on file    Inability: Not on file  . Transportation needs:    Medical: Not on file    Non-medical: Not on file  Tobacco Use  . Smoking status: Former Smoker    Last attempt to quit: 09/27/2008    Years since quitting: 9.9  . Smokeless tobacco: Never Used  Substance and Sexual Activity  . Alcohol use: Yes    Alcohol/week: 0.0 standard drinks    Comment: weekend only...3 drinks  . Drug use: No  . Sexual activity: Yes    Partners: Male    Comment: 1st intercourse- 51, partners- 2, married- 47 yrs   Lifestyle  . Physical activity:    Days per week: Not on file    Minutes per session: Not on file  . Stress: Not on file  Relationships  . Social connections:    Talks on phone: Not on file    Gets together: Not on file    Attends religious service: Not on file    Active member of club  or organization: Not on file    Attends meetings of clubs or organizations: Not on file    Relationship status: Not on file  Other Topics Concern  . 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        Has smoke detector and wears seat belts.  No firearms stored safely. . No excess sun exposure. Sees dentist regularly . No depression   G4P2             Outpatient Medications Prior to Visit  Medication Sig Dispense Refill  . aspirin 81 MG tablet Take 81 mg by mouth daily.     . calcium carbonate (OS-CAL) 1250 MG chewable tablet Chew 1 tablet by mouth daily.      . fish oil-omega-3 fatty acids 1000 MG capsule Take 2 g by mouth daily.      . MULTIPLE VITAMIN PO Take by mouth.      Marland Kitchen amoxicillin-clavulanate (AUGMENTIN) 875-125 MG tablet Take 1 tablet by mouth every 12 (twelve) hours. 14 tablet 0   No facility-administered medications prior to visit.      EXAM:  BP 102/62 (BP Location: Right Arm, Patient Position: Sitting, Cuff Size: Normal)   Pulse (!) 58   Temp 98.1 F (36.7 C) (Oral)   Ht 5' 3.58" (1.615 m)   Wt 131 lb 14.4 oz (59.8 kg)   SpO2 99%   BMI 22.94 kg/m   Body mass index is 22.94 kg/m. Wt Readings from Last 3 Encounters:  09/11/18 131 lb 14.4 oz (59.8 kg)  05/25/18 138 lb (62.6 kg)  05/23/18 138 lb (62.6 kg)    Physical Exam: Vital signs reviewed GYJ:EHUD is a well-developed well-nourished alert cooperative    who appearsr stated age in no acute distress.  HEENT: normocephalic atraumatic , Eyes: PERRL EOM's full, conjunctiva clear, Nares: paten,t no deformity discharge or tenderness., Ears: no deformity EAC's clear TMs with normal landmarks. Mouth: clear OP, no lesions, edema.  Moist mucous membranes.  Dentition in adequate repair. NECK: supple without masses, thyromegaly or  bruits. CHEST/PULM:  Clear to auscultation and percussion breath sounds equal no wheeze , rales or rhonchi. No chest wall deformities or tenderness. Breast: normal by inspection . No dimpling, discharge, masses, tenderness or discharge . CV: PMI is nondisplaced, S1 S2 no gallops, murmurs, rubs. Peripheral pulses are full without delay.No JVD .  ABDOMEN: Bowel sounds normal nontender  No guard or rebound, no hepato splenomegal no CVA tenderness.  No hernia. Extremtities:  No clubbing cyanosis or edema, no acute joint swelling or redness no focal atrophy righ lateral post ankle with healed  Laceration  slightly puffy but nl rom and gait  NEURO:  Oriented x3, cranial nerves 3-12 appear to be intact, no obvious focal weakness,gait within normal limits no abnormal reflexes or asymmetrical SKIN: No acute rashes normal turgor, color, no bruising or petechiae. PSYCH: Oriented, good eye contact, no obvious depression anxiety, cognition and judgment appear normal. LN: no cervical axillary inguinal adenopathy    BP Readings from Last 3 Encounters:  09/11/18 102/62  05/25/18 116/72  05/23/18 108/60   The 10-year ASCVD risk score Mikey Bussing DC Jr., et al., 2013) is: 1.2%   Values used to calculate the score:     Age: 33 years     Sex: Female     Is Non-Hispanic African American: No     Diabetic: No     Tobacco smoker: No     Systolic Blood Pressure: 098 mmHg     Is BP treated: No     HDL Cholesterol: 74 mg/dL     Total Cholesterol: 277 mg/dL   ASSESSMENT AND PLAN:  Discussed the following assessment and plan:  Visit for preventive health examination - Plan: Basic metabolic panel, CBC with Differential/Platelet, Hepatic function panel, Lipid panel, TSH, T4, free  Hyperlipidemia, unspecified hyperlipidemia type - Plan: Basic metabolic panel, CBC with Differential/Platelet, Hepatic function panel, Lipid panel, TSH, T4, free  Weight loss - Plan: Basic metabolic panel, CBC with Differential/Platelet,  Hepatic function panel, Lipid panel, TSH, T4, free  Injury of right ankle, sequela  Perimenopausal Seems healthy suspect  Sx may be nl aging at menopause but  Follow and  R/O metabolic       Expectant management.  Lab pending today  Has form   [employer] for insurance  Patient Care Team: Panosh, Standley Brooking, MD as PCP - General Lavonna Monarch, MD (Dermatology) Juanita Craver, MD as Consulting Physician (Gastroenterology) Patient Instructions    Check back  With  Ortho if still bothering   You    After another 1-2  Months   Continue lifestyle intervention healthy eating and exercise . Perimenopaus  and menopause can  slow down processing  focus .  Sleep exercise and decrease rapid switching  Tasks as posible Discuss with gyne also .  Will notify you  of labs when available.   Preventive Care 40-64 Years, Female Preventive care refers to lifestyle choices and visits with your health care provider that can promote health and wellness. What does preventive care include?  A yearly physical exam. This is also called an annual well check.  Dental exams once or twice a year.  Routine eye exams. Ask your health care provider how often you should have your eyes checked.  Personal lifestyle choices, including: ? Daily care of your teeth and gums. ? Regular physical activity. ? Eating a healthy diet. ? Avoiding tobacco and drug use. ? Limiting alcohol use. ? Practicing  safe sex. ? Taking low-dose aspirin daily starting at age 46. ? Taking vitamin and mineral supplements as recommended by your health care provider. What happens during an annual well check? The services and screenings done by your health care provider during your annual well check will depend on your age, overall health, lifestyle risk factors, and family history of disease. Counseling Your health care provider may ask you questions about your:  Alcohol use.  Tobacco use.  Drug use.  Emotional well-being.  Home and  relationship well-being.  Sexual activity.  Eating habits.  Work and work Statistician.  Method of birth control.  Menstrual cycle.  Pregnancy history.  Screening You may have the following tests or measurements:  Height, weight, and BMI.  Blood pressure.  Lipid and cholesterol levels. These may be checked every 5 years, or more frequently if you are over 31 years old.  Skin check.  Lung cancer screening. You may have this screening every year starting at age 16 if you have a 30-pack-year history of smoking and currently smoke or have quit within the past 15 years.  Fecal occult blood test (FOBT) of the stool. You may have this test every year starting at age 41.  Flexible sigmoidoscopy or colonoscopy. You may have a sigmoidoscopy every 5 years or a colonoscopy every 10 years starting at age 83.  Hepatitis C blood test.  Hepatitis B blood test.  Sexually transmitted disease (STD) testing.  Diabetes screening. This is done by checking your blood sugar (glucose) after you have not eaten for a while (fasting). You may have this done every 1-3 years.  Mammogram. This may be done every 1-2 years. Talk to your health care provider about when you should start having regular mammograms. This may depend on whether you have a family history of breast cancer.  BRCA-related cancer screening. This may be done if you have a family history of breast, ovarian, tubal, or peritoneal cancers.  Pelvic exam and Pap test. This may be done every 3 years starting at age 46. Starting at age 64, this may be done every 5 years if you have a Pap test in combination with an HPV test.  Bone density scan. This is done to screen for osteoporosis. You may have this scan if you are at high risk for osteoporosis.  Discuss your test results, treatment options, and if necessary, the need for more tests with your health care provider. Vaccines Your health care provider may recommend certain vaccines, such  as:  Influenza vaccine. This is recommended every year.  Tetanus, diphtheria, and acellular pertussis (Tdap, Td) vaccine. You may need a Td booster every 10 years.  Varicella vaccine. You may need this if you have not been vaccinated.  Zoster vaccine. You may need this after age 2.  Measles, mumps, and rubella (MMR) vaccine. You may need at least one dose of MMR if you were born in 1957 or later. You may also need a second dose.  Pneumococcal 13-valent conjugate (PCV13) vaccine. You may need this if you have certain conditions and were not previously vaccinated.  Pneumococcal polysaccharide (PPSV23) vaccine. You may need one or two doses if you smoke cigarettes or if you have certain conditions.  Meningococcal vaccine. You may need this if you have certain conditions.  Hepatitis A vaccine. You may need this if you have certain conditions or if you travel or work in places where you may be exposed to hepatitis A.  Hepatitis B vaccine. You may need  this if you have certain conditions or if you travel or work in places where you may be exposed to hepatitis B.  Haemophilus influenzae type b (Hib) vaccine. You may need this if you have certain conditions.  Talk to your health care provider about which screenings and vaccines you need and how often you need them. This information is not intended to replace advice given to you by your health care provider. Make sure you discuss any questions you have with your health care provider. Document Released: 11/13/2015 Document Revised: 07/06/2016 Document Reviewed: 08/18/2015 Elsevier Interactive Patient Education  2018 [place]. Panosh M.D.

## 2018-09-11 NOTE — Patient Instructions (Addendum)
Check back  With  Ortho if still bothering   You    After another 1-2  Months   Continue lifestyle intervention healthy eating and exercise . Perimenopaus  and menopause can  slow down processing  focus .  Sleep exercise and decrease rapid switching  Tasks as posible Discuss with gyne also .  Will notify you  of labs when available.   Preventive Care 40-64 Years, Female Preventive care refers to lifestyle choices and visits with your health care provider that can promote health and wellness. What does preventive care include?  A yearly physical exam. This is also called an annual well check.  Dental exams once or twice a year.  Routine eye exams. Ask your health care provider how often you should have your eyes checked.  Personal lifestyle choices, including: ? Daily care of your teeth and gums. ? Regular physical activity. ? Eating a healthy diet. ? Avoiding tobacco and drug use. ? Limiting alcohol use. ? Practicing safe sex. ? Taking low-dose aspirin daily starting at age 28. ? Taking vitamin and mineral supplements as recommended by your health care provider. What happens during an annual well check? The services and screenings done by your health care provider during your annual well check will depend on your age, overall health, lifestyle risk factors, and family history of disease. Counseling Your health care provider may ask you questions about your:  Alcohol use.  Tobacco use.  Drug use.  Emotional well-being.  Home and relationship well-being.  Sexual activity.  Eating habits.  Work and work Statistician.  Method of birth control.  Menstrual cycle.  Pregnancy history.  Screening You may have the following tests or measurements:  Height, weight, and BMI.  Blood pressure.  Lipid and cholesterol levels. These may be checked every 5 years, or more frequently if you are over 32 years old.  Skin check.  Lung cancer screening. You may have this  screening every year starting at age 6 if you have a 30-pack-year history of smoking and currently smoke or have quit within the past 15 years.  Fecal occult blood test (FOBT) of the stool. You may have this test every year starting at age 75.  Flexible sigmoidoscopy or colonoscopy. You may have a sigmoidoscopy every 5 years or a colonoscopy every 10 years starting at age 27.  Hepatitis C blood test.  Hepatitis B blood test.  Sexually transmitted disease (STD) testing.  Diabetes screening. This is done by checking your blood sugar (glucose) after you have not eaten for a while (fasting). You may have this done every 1-3 years.  Mammogram. This may be done every 1-2 years. Talk to your health care provider about when you should start having regular mammograms. This may depend on whether you have a family history of breast cancer.  BRCA-related cancer screening. This may be done if you have a family history of breast, ovarian, tubal, or peritoneal cancers.  Pelvic exam and Pap test. This may be done every 3 years starting at age 25. Starting at age 46, this may be done every 5 years if you have a Pap test in combination with an HPV test.  Bone density scan. This is done to screen for osteoporosis. You may have this scan if you are at high risk for osteoporosis.  Discuss your test results, treatment options, and if necessary, the need for more tests with your health care provider. Vaccines Your health care provider may recommend certain vaccines, such as:  Influenza vaccine. This is recommended every year.  Tetanus, diphtheria, and acellular pertussis (Tdap, Td) vaccine. You may need a Td booster every 10 years.  Varicella vaccine. You may need this if you have not been vaccinated.  Zoster vaccine. You may need this after age 68.  Measles, mumps, and rubella (MMR) vaccine. You may need at least one dose of MMR if you were born in 1957 or later. You may also need a second  dose.  Pneumococcal 13-valent conjugate (PCV13) vaccine. You may need this if you have certain conditions and were not previously vaccinated.  Pneumococcal polysaccharide (PPSV23) vaccine. You may need one or two doses if you smoke cigarettes or if you have certain conditions.  Meningococcal vaccine. You may need this if you have certain conditions.  Hepatitis A vaccine. You may need this if you have certain conditions or if you travel or work in places where you may be exposed to hepatitis A.  Hepatitis B vaccine. You may need this if you have certain conditions or if you travel or work in places where you may be exposed to hepatitis B.  Haemophilus influenzae type b (Hib) vaccine. You may need this if you have certain conditions.  Talk to your health care provider about which screenings and vaccines you need and how often you need them. This information is not intended to replace advice given to you by your health care provider. Make sure you discuss any questions you have with your health care provider. Document Released: 11/13/2015 Document Revised: 07/06/2016 Document Reviewed: 08/18/2015 Elsevier Interactive Patient Education  [employer].

## 2018-09-12 LAB — CBC WITH DIFFERENTIAL/PLATELET
BASOS ABS: 0.1 10*3/uL (ref 0.0–0.1)
BASOS PCT: 0.9 % (ref 0.0–3.0)
Eosinophils Absolute: 0.4 10*3/uL (ref 0.0–0.7)
Eosinophils Relative: 6.5 % — ABNORMAL HIGH (ref 0.0–5.0)
HEMATOCRIT: 41.6 % (ref 36.0–46.0)
HEMOGLOBIN: 14 g/dL (ref 12.0–15.0)
LYMPHS PCT: 36.6 % (ref 12.0–46.0)
Lymphs Abs: 2.3 10*3/uL (ref 0.7–4.0)
MCHC: 33.7 g/dL (ref 30.0–36.0)
MCV: 91.1 fl (ref 78.0–100.0)
MONOS PCT: 7.9 % (ref 3.0–12.0)
Monocytes Absolute: 0.5 10*3/uL (ref 0.1–1.0)
NEUTROS ABS: 3 10*3/uL (ref 1.4–7.7)
Neutrophils Relative %: 48.1 % (ref 43.0–77.0)
PLATELETS: 322 10*3/uL (ref 150.0–400.0)
RBC: 4.56 Mil/uL (ref 3.87–5.11)
RDW: 13.7 % (ref 11.5–15.5)
WBC: 6.2 10*3/uL (ref 4.0–10.5)

## 2018-09-12 LAB — LIPID PANEL
Cholesterol: 273 mg/dL — ABNORMAL HIGH (ref 0–200)
HDL: 73.3 mg/dL (ref >39.00)
LDL Cholesterol: 179 mg/dL — ABNORMAL HIGH (ref 0–99)
NONHDL: 200.16
Total CHOL/HDL Ratio: 4
Triglycerides: 104 mg/dL (ref 0.0–149.0)
VLDL: 20.8 mg/dL (ref 0.0–40.0)

## 2018-09-12 LAB — HEPATIC FUNCTION PANEL
ALBUMIN: 4.5 g/dL (ref 3.5–5.2)
ALK PHOS: 53 U/L (ref 39–117)
ALT: 13 U/L (ref 0–35)
AST: 17 U/L (ref 0–37)
Bilirubin, Direct: 0.1 mg/dL (ref 0.0–0.3)
TOTAL PROTEIN: 6.8 g/dL (ref 6.0–8.3)
Total Bilirubin: 0.7 mg/dL (ref 0.2–1.2)

## 2018-09-12 LAB — BASIC METABOLIC PANEL
BUN: 17 mg/dL (ref 6–23)
CO2: 30 mEq/L (ref 19–32)
Calcium: 9.9 mg/dL (ref 8.4–10.5)
Chloride: 102 mEq/L (ref 96–112)
Creatinine, Ser: 0.82 mg/dL (ref 0.40–1.20)
GFR: 76.99 mL/min (ref >60.00)
Glucose, Bld: 89 mg/dL (ref 70–99)
Potassium: 4.5 mEq/L (ref 3.5–5.1)
Sodium: 139 mEq/L (ref 135–145)

## 2018-09-12 LAB — T4, FREE: Free T4: 0.8 ng/dL (ref 0.60–1.60)

## 2018-09-12 LAB — TSH: TSH: 2.36 u[IU]/mL (ref 0.35–4.50)

## 2018-10-05 ENCOUNTER — Encounter: Payer: 59 | Admitting: Obstetrics & Gynecology

## 2018-10-16 ENCOUNTER — Encounter: Payer: Self-pay | Admitting: Internal Medicine

## 2018-10-22 ENCOUNTER — Encounter: Payer: Self-pay | Admitting: Obstetrics & Gynecology

## 2018-10-22 ENCOUNTER — Ambulatory Visit (INDEPENDENT_AMBULATORY_CARE_PROVIDER_SITE_OTHER): Payer: 59 | Admitting: Obstetrics & Gynecology

## 2018-10-22 VITALS — BP 106/70 | Ht 64.0 in | Wt 133.2 lb

## 2018-10-22 DIAGNOSIS — Z01419 Encounter for gynecological examination (general) (routine) without abnormal findings: Secondary | ICD-10-CM

## 2018-10-22 DIAGNOSIS — Z789 Other specified health status: Secondary | ICD-10-CM

## 2018-10-22 DIAGNOSIS — N951 Menopausal and female climacteric states: Secondary | ICD-10-CM | POA: Diagnosis not present

## 2018-10-22 NOTE — Progress Notes (Signed)
Danielle MarekLisa P Graham 07/25/1966 [id number]   History:    52 y.o. [id number] Married.  Vasectomy  RP:  Established patient presenting for annual gyn exam   HPI: Light menses every 2-3 months.  No BTB.  No pelvic pain.  No pain with IC.  H/O LEEP in 2005, margins negative.  Stopped Detrol and is actually doing better with urine control. Bowel movements normal.  Breasts normal.  Body mass index 22.86.  Health labs with family physician.  Hypercholesterolemia on low-cholesterol diet.  Past medical history,surgical history, family history and social history were all reviewed and documented in the EPIC chart.  Gynecologic History Patient's last menstrual period was 08/20/2018. Contraception: Vasectomy Last Pap: 08/2017. Results were: Negative/HPV HR neg Last mammogram: 05/2018. Results were: Benign Bone Density: Never Colonoscopy: 2016  Obstetric History OB History  Gravida Para Term Preterm AB Living  4 2 2   2 2   SAB TAB Ectopic Multiple Live Births  2       2    # Outcome Date GA Lbr Len/2nd Weight Sex Delivery Anes PTL Lv  4 SAB           3 SAB           2 Term     M Vag-Spont  N LIV  1 Term     F Vag-Spont  N LIV     ROS: A ROS was performed and pertinent positives and negatives are included in the history.  GENERAL: No fevers or chills. HEENT: No change in vision, no earache, sore throat or sinus congestion. NECK: No pain or stiffness. CARDIOVASCULAR: No chest pain or pressure. No palpitations. PULMONARY: No shortness of breath, cough or wheeze. GASTROINTESTINAL: No abdominal pain, nausea, vomiting or diarrhea, melena or bright red blood per rectum. GENITOURINARY: No urinary frequency, urgency, hesitancy or dysuria. MUSCULOSKELETAL: No joint or muscle pain, no back pain, no recent trauma. DERMATOLOGIC: No rash, no itching, no lesions. ENDOCRINE: No polyuria, polydipsia, no heat or cold intolerance. No recent change in weight. HEMATOLOGICAL: No anemia or easy bruising or bleeding.  NEUROLOGIC: No headache, seizures, numbness, tingling or weakness. PSYCHIATRIC: No depression, no loss of interest in normal activity or change in sleep pattern.     Exam:   BP 106/70   Ht 5\' 4"  (1.626 m)   Wt 133 lb 3.2 oz (60.4 kg)   LMP 08/20/2018   BMI 22.86 kg/m   Body mass index is 22.86 kg/m.  General appearance : Well developed well nourished female. No acute distress HEENT: Eyes: no retinal hemorrhage or exudates,  Neck supple, trachea midline, no carotid bruits, no thyroidmegaly Lungs: Clear to auscultation, no rhonchi or wheezes, or rib retractions  Heart: Regular rate and rhythm, no murmurs or gallops Breast:Examined in sitting and supine position were symmetrical in appearance, no palpable masses or tenderness,  no skin retraction, no nipple inversion, no nipple discharge, no skin discoloration, no axillary or supraclavicular lymphadenopathy Abdomen: no palpable masses or tenderness, no rebound or guarding Extremities: no edema or skin discoloration or tenderness  Pelvic: Vulva: Normal             Vagina: No gross lesions or discharge  Cervix: No gross lesions or discharge  Uterus  AV, normal size, shape and consistency, non-tender and mobile  Adnexa  Without masses or tenderness  Anus: Normal   Assessment/Plan:  52 y.o. female for annual exam   1. Encounter for gynecological examination with Papanicolaou smear of cervix Normal  gynecologic exam.  History of LEEP, margins negative in 2005.  Pap reflex done today.  Breast exam normal.  Screening mammogram in August 2019 was benign.  Good body mass index at 22.86.  Aerobic physical activities 5 times a week and weightlifting every 2 days recommended.  Health labs with family physician. - Pap IG w/ reflex to HPV when ASC-U  2. Perimenopause Oligomenorrhea every 2 to 3 months.  Mild hot flushes.  Precautions given concerning irregular heavy bleeding, in that case would call for evaluation with a pelvic ultrasound.  Will  observe at this time.  3. Relies on vasectomy as primary birth control method  Danielle DelMarie-Lyne Elchonon Maxson MD, 1:39 PM 10/22/2018

## 2018-10-22 NOTE — Patient Instructions (Signed)
1. Encounter for gynecological examination with Papanicolaou smear of cervix Normal gynecologic exam.  History of LEEP, margins negative in 2005.  Pap reflex done today.  Breast exam normal.  Screening mammogram in August 2019 was benign.  Good body mass index at 22.86.  Aerobic physical activities 5 times a week and weightlifting every 2 days recommended.  Health labs with family physician. - Pap IG w/ reflex to HPV when ASC-U  2. Perimenopause Oligomenorrhea every 2 to 3 months.  Mild hot flushes.  Precautions given concerning irregular heavy bleeding, in that case would call for evaluation with a pelvic ultrasound.  Will observe at this time.  3. Relies on vasectomy as primary birth control method  Danielle Graham, it was a pleasure seeing you today!  I will inform you of your results as soon as they are available.

## 2018-10-25 ENCOUNTER — Encounter: Payer: Self-pay | Admitting: *Deleted

## 2018-10-25 LAB — PAP IG W/ RFLX HPV ASCU

## 2019-07-02 LAB — HM MAMMOGRAPHY

## 2019-08-01 ENCOUNTER — Encounter: Payer: Self-pay | Admitting: Internal Medicine

## 2019-10-07 ENCOUNTER — Encounter: Payer: 59 | Admitting: Internal Medicine

## 2019-11-05 ENCOUNTER — Other Ambulatory Visit: Payer: Self-pay

## 2019-11-06 ENCOUNTER — Encounter: Payer: Self-pay | Admitting: Obstetrics & Gynecology

## 2019-11-06 ENCOUNTER — Ambulatory Visit (INDEPENDENT_AMBULATORY_CARE_PROVIDER_SITE_OTHER): Payer: 59 | Admitting: Obstetrics & Gynecology

## 2019-11-06 VITALS — BP 122/80 | Wt 142.0 lb

## 2019-11-06 DIAGNOSIS — Z9889 Other specified postprocedural states: Secondary | ICD-10-CM

## 2019-11-06 DIAGNOSIS — Z01419 Encounter for gynecological examination (general) (routine) without abnormal findings: Secondary | ICD-10-CM

## 2019-11-06 DIAGNOSIS — Z780 Asymptomatic menopausal state: Secondary | ICD-10-CM | POA: Diagnosis not present

## 2019-11-06 DIAGNOSIS — Z789 Other specified health status: Secondary | ICD-10-CM

## 2019-11-06 NOTE — Progress Notes (Signed)
Danielle Graham 1966/05/09 [id number]   History:    54 y.o. [id number] Married.  Vasectomy  RP:  Established patient presenting for annual gyn exam   HPI: Menopause with no menses x >6 months.  Mild night sweats.  No pelvic pain.  No pain with IC, but some dryness, using [place].  H/O LEEP in 2005, margins negative.  Stopped Detrol and is actually doing better with urine control. Bowel movements normal.  Breasts normal.  Body mass index 24.37.  Health labs with family physician.  Hypercholesterolemia on low-cholesterol diet.  Past medical history,surgical history, family history and social history were all reviewed and documented in the EPIC chart.  Gynecologic History Patient's last menstrual period was 05/06/2019.  Obstetric History OB History  Gravida Para Term Preterm AB Living  4 2 2   2 2   SAB TAB Ectopic Multiple Live Births  2       2    # Outcome Date GA Lbr Len/2nd Weight Sex Delivery Anes PTL Lv  4 SAB           3 SAB           2 Term     M Vag-Spont  N LIV  1 Term     F Vag-Spont  N LIV     ROS: A ROS was performed and pertinent positives and negatives are included in the history.  GENERAL: No fevers or chills. HEENT: No change in vision, no earache, sore throat or sinus congestion. NECK: No pain or stiffness. CARDIOVASCULAR: No chest pain or pressure. No palpitations. PULMONARY: No shortness of breath, cough or wheeze. GASTROINTESTINAL: No abdominal pain, nausea, vomiting or diarrhea, melena or bright red blood per rectum. GENITOURINARY: No urinary frequency, urgency, hesitancy or dysuria. MUSCULOSKELETAL: No joint or muscle pain, no back pain, no recent trauma. DERMATOLOGIC: No rash, no itching, no lesions. ENDOCRINE: No polyuria, polydipsia, no heat or cold intolerance. No recent change in weight. HEMATOLOGICAL: No anemia or easy bruising or bleeding. NEUROLOGIC: No headache, seizures, numbness, tingling or weakness. PSYCHIATRIC: No depression, no loss of interest in normal  activity or change in sleep pattern.     Exam:   BP 122/80 (BP Location: Right Arm, Patient Position: Sitting, Cuff Size: Normal)   Wt 142 lb (64.4 kg)   LMP 05/06/2019   BMI 24.37 kg/m   Body mass index is 24.37 kg/m.  General appearance : Well developed well nourished female. No acute distress HEENT: Eyes: no retinal hemorrhage or exudates,  Neck supple, trachea midline, no carotid bruits, no thyroidmegaly Lungs: Clear to auscultation, no rhonchi or wheezes, or rib retractions  Heart: Regular rate and rhythm, no murmurs or gallops Breast:Examined in sitting and supine position were symmetrical in appearance, no palpable masses or tenderness,  no skin retraction, no nipple inversion, no nipple discharge, no skin discoloration, no axillary or supraclavicular lymphadenopathy Abdomen: no palpable masses or tenderness, no rebound or guarding Extremities: no edema or skin discoloration or tenderness  Pelvic: Vulva: Normal             Vagina: No gross lesions or discharge  Cervix: No gross lesions or discharge.  Pap reflex done.  Uterus  AV, normal size, shape and consistency, non-tender and mobile  Adnexa  Without masses or tenderness  Anus: Normal   Assessment/Plan:  54 y.o. female for annual exam   1. Encounter for routine gynecological examination with Papanicolaou smear of cervix Normal gynecologic exam in menopause.  Pap reflex  done.  Breast exam normal.  Screening mammogram September 2020 was benign.  Colonoscopy in February 2016.  Health labs with family physician. - Pap IG w/ reflex to HPV when ASC-U  2. Relies on vasectomy as primary birth control method  3. Postmenopause Well on no hormone replacement therapy.  No postmenopausal bleeding.  Vitamin D supplements, calcium intake of 1200 mg daily and regular weightbearing physical activity is recommended.  4. H/O LEEP LEEP in 2005 with margins negative.  Pap test negative since then.  Princess Bruins MD, 4:10 PM  11/06/2019

## 2019-11-07 LAB — PAP IG W/ RFLX HPV ASCU

## 2019-11-08 ENCOUNTER — Ambulatory Visit: Payer: Self-pay | Attending: Internal Medicine

## 2019-11-08 ENCOUNTER — Ambulatory Visit: Payer: 59 | Attending: Internal Medicine

## 2019-11-08 DIAGNOSIS — Z20822 Contact with and (suspected) exposure to covid-19: Secondary | ICD-10-CM | POA: Insufficient documentation

## 2019-11-10 LAB — NOVEL CORONAVIRUS, NAA: SARS-CoV-2, NAA: NOT DETECTED

## 2019-11-11 ENCOUNTER — Encounter: Payer: Self-pay | Admitting: Obstetrics & Gynecology

## 2019-11-11 NOTE — Patient Instructions (Signed)
1. Encounter for routine gynecological examination with Papanicolaou smear of cervix Normal gynecologic exam in menopause.  Pap reflex done.  Breast exam normal.  Screening mammogram September 2020 was benign.  Colonoscopy in February 2016.  Health labs with family physician. - Pap IG w/ reflex to HPV when ASC-U  2. Relies on vasectomy as primary birth control method  3. Postmenopause Well on no hormone replacement therapy.  No postmenopausal bleeding.  Vitamin D supplements, calcium intake of 1200 mg daily and regular weightbearing physical activity is recommended.  4. H/O LEEP LEEP in 2005 with margins negative.  Pap test negative since then.  Aryelle, it was a pleasure seeing you today!  I will inform you of your results as soon as they are available.

## 2019-11-26 ENCOUNTER — Other Ambulatory Visit: Payer: Self-pay

## 2019-11-27 ENCOUNTER — Ambulatory Visit (INDEPENDENT_AMBULATORY_CARE_PROVIDER_SITE_OTHER): Payer: 59 | Admitting: Internal Medicine

## 2019-11-27 ENCOUNTER — Encounter: Payer: Self-pay | Admitting: Internal Medicine

## 2019-11-27 VITALS — BP 116/62 | HR 66 | Temp 97.9°F | Ht 64.0 in | Wt 142.0 lb

## 2019-11-27 DIAGNOSIS — R1310 Dysphagia, unspecified: Secondary | ICD-10-CM | POA: Diagnosis not present

## 2019-11-27 DIAGNOSIS — Z0000 Encounter for general adult medical examination without abnormal findings: Secondary | ICD-10-CM

## 2019-11-27 DIAGNOSIS — E785 Hyperlipidemia, unspecified: Secondary | ICD-10-CM

## 2019-11-27 LAB — HEPATIC FUNCTION PANEL
ALT: 13 U/L (ref 0–35)
AST: 17 U/L (ref 0–37)
Albumin: 4.4 g/dL (ref 3.5–5.2)
Alkaline Phosphatase: 69 U/L (ref 39–117)
Bilirubin, Direct: 0.1 mg/dL (ref 0.0–0.3)
Total Bilirubin: 0.9 mg/dL (ref 0.2–1.2)
Total Protein: 6.6 g/dL (ref 6.0–8.3)

## 2019-11-27 LAB — BASIC METABOLIC PANEL
BUN: 12 mg/dL (ref 6–23)
CO2: 30 mEq/L (ref 19–32)
Calcium: 9.7 mg/dL (ref 8.4–10.5)
Chloride: 101 mEq/L (ref 96–112)
Creatinine, Ser: 0.74 mg/dL (ref 0.40–1.20)
GFR: 81.19 mL/min (ref >60.00)
Glucose, Bld: 87 mg/dL (ref 70–99)
Potassium: 4.2 mEq/L (ref 3.5–5.1)
Sodium: 137 mEq/L (ref 135–145)

## 2019-11-27 LAB — HEMOGLOBIN A1C: Hgb A1c MFr Bld: 5.8 % (ref 4.6–6.5)

## 2019-11-27 LAB — CBC WITH DIFFERENTIAL/PLATELET
Basophils Absolute: 0.1 10*3/uL (ref 0.0–0.1)
Basophils Relative: 0.9 % (ref 0.0–3.0)
Eosinophils Absolute: 0.3 10*3/uL (ref 0.0–0.7)
Eosinophils Relative: 6.3 % — ABNORMAL HIGH (ref 0.0–5.0)
HCT: 41.7 % (ref 36.0–46.0)
Hemoglobin: 13.9 g/dL (ref 12.0–15.0)
Lymphocytes Relative: 37.4 % (ref 12.0–46.0)
Lymphs Abs: 2 10*3/uL (ref 0.7–4.0)
MCHC: 33.3 g/dL (ref 30.0–36.0)
MCV: 91.2 fl (ref 78.0–100.0)
Monocytes Absolute: 0.5 10*3/uL (ref 0.1–1.0)
Monocytes Relative: 8.7 % (ref 3.0–12.0)
Neutro Abs: 2.5 10*3/uL (ref 1.4–7.7)
Neutrophils Relative %: 46.7 % (ref 43.0–77.0)
Platelets: 319 10*3/uL (ref 150.0–400.0)
RBC: 4.57 Mil/uL (ref 3.87–5.11)
RDW: 13.6 % (ref 11.5–15.5)
WBC: 5.4 10*3/uL (ref 4.0–10.5)

## 2019-11-27 LAB — TSH: TSH: 2.02 u[IU]/mL (ref 0.35–4.50)

## 2019-11-27 LAB — LIPID PANEL
Cholesterol: 321 mg/dL — ABNORMAL HIGH (ref 0–200)
HDL: 80.7 mg/dL (ref >39.00)
LDL Cholesterol: 212 mg/dL — ABNORMAL HIGH (ref 0–99)
NonHDL: 240.38
Total CHOL/HDL Ratio: 4
Triglycerides: 144 mg/dL (ref 0.0–149.0)
VLDL: 28.8 mg/dL (ref 0.0–40.0)

## 2019-11-27 NOTE — Patient Instructions (Signed)
Will notify you  of labs when available. And will get form sent in.  Get appt with dr Loreta Ave about the esophageal sx .  probably eosinophilic esophagitis.   Let us know if an y concerning sx  But your exam is good today .    Health Maintenance, Female Adopting a healthy lifestyle and getting preventive care are important in promoting health and wellness. Ask your health care provider about:  The right schedule for you to have regular tests and exams.  Things you can do on your own to prevent diseases and keep yourself healthy. What should I know about diet, weight, and exercise? Eat a healthy diet   Eat a diet that includes plenty of vegetables, fruits, low-fat dairy products, and lean protein.  Do not eat a lot of foods that are high in solid fats, added sugars, or sodium. Maintain a healthy weight Body mass index (BMI) is used to identify weight problems. It estimates body fat based on height and weight. Your health care provider can help determine your BMI and help you achieve or maintain a healthy weight. Get regular exercise Get regular exercise. This is one of the most important things you can do for your health. Most adults should:  Exercise for at least 150 minutes each week. The exercise should increase your heart rate and make you sweat (moderate-intensity exercise).  Do strengthening exercises at least twice a week. This is in addition to the moderate-intensity exercise.  Spend less time sitting. Even light physical activity can be beneficial. Watch cholesterol and blood lipids Have your blood tested for lipids and cholesterol at 56 years of age, then have this test every 5 years. Have your cholesterol levels checked more often if:  Your lipid or cholesterol levels are high.  You are older than 56 years of age.  You are at high risk for heart disease. What should I know about cancer screening? Depending on your health history and family history, you may need to have  cancer screening at various ages. This may include screening for:  Breast cancer.  Cervical cancer.  Colorectal cancer.  Skin cancer.  Lung cancer. What should I know about heart disease, diabetes, and high blood pressure? Blood pressure and heart disease  High blood pressure causes heart disease and increases the risk of stroke. This is more likely to develop in people who have high blood pressure readings, are of African descent, or are overweight.  Have your blood pressure checked: ? Every 3-5 years if you are 54-11 years of age. ? Every year if you are 27 years old or older. Diabetes Have regular diabetes screenings. This checks your fasting blood sugar level. Have the screening done:  Once every three years after age 17 if you are at a normal weight and have a low risk for diabetes.  More often and at a younger age if you are overweight or have a high risk for diabetes. What should I know about preventing infection? Hepatitis B If you have a higher risk for hepatitis B, you should be screened for this virus. Talk with your health care provider to find out if you are at risk for hepatitis B infection. Hepatitis C Testing is recommended for:  Everyone born from [birth year] through 1967.  Anyone with known risk factors for hepatitis C. Sexually transmitted infections (STIs)  Get screened for STIs, including gonorrhea and chlamydia, if: ? You are sexually active and are younger than 56 years of age. ? You are  older than 56 years of age and your health care provider tells you that you are at risk for this type of infection. ? Your sexual activity has changed since you were last screened, and you are at increased risk for chlamydia or gonorrhea. Ask your health care provider if you are at risk.  Ask your health care provider about whether you are at high risk for HIV. Your health care provider may recommend a prescription medicine to help prevent HIV infection. If you choose to take  medicine to prevent HIV, you should first get tested for HIV. You should then be tested every 3 months for as long as you are taking the medicine. Pregnancy  If you are about to stop having your period (premenopausal) and you may become pregnant, seek counseling before you get pregnant.  Take 400 to 800 micrograms (mcg) of folic acid every day if you become pregnant.  Ask for birth control (contraception) if you want to prevent pregnancy. Osteoporosis and menopause Osteoporosis is a disease in which the bones lose minerals and strength with aging. This can result in bone fractures. If you are 41 years old or older, or if you are at risk for osteoporosis and fractures, ask your health care provider if you should:  Be screened for bone loss.  Take a calcium or vitamin D supplement to lower your risk of fractures.  Be given hormone replacement therapy (HRT) to treat symptoms of menopause. Follow these instructions at home: Lifestyle  Do not use any products that contain nicotine or tobacco, such as cigarettes, e-cigarettes, and chewing tobacco. If you need help quitting, ask your health care provider.  Do not use street drugs.  Do not share needles.  Ask your health care provider for help if you need support or information about quitting drugs. Alcohol use  Do not drink alcohol if: ? Your health care provider tells you not to drink. ? You are pregnant, may be pregnant, or are planning to become pregnant.  If you drink alcohol: ? Limit how much you use to 0-1 drink a day. ? Limit intake if you are breastfeeding.  Be aware of how much alcohol is in your drink. In the [place] one drink equals one 12 oz bottle of beer (355 mL), one 5 oz glass of wine (148 mL), or one 1 oz glass of hard liquor (44 mL). General instructions  Schedule regular health, dental, and eye exams.  Stay current with your vaccines.  Tell your health care provider if: ? You often feel depressed. ? You have  ever been abused or do not feel safe at home. Summary  Adopting a healthy lifestyle and getting preventive care are important in promoting health and wellness.  Follow your health care provider's instructions about healthy diet, exercising, and getting tested or screened for diseases.  Follow your health care provider's instructions on monitoring your cholesterol and blood pressure. This information is not intended to replace advice given to you by your health care provider. Make sure you discuss any questions you have with your health care provider. Document Revised: 10/10/2018 Document Reviewed: 10/10/2018 Elsevier Patient Education  2020 [employer].

## 2019-11-27 NOTE — Progress Notes (Signed)
This visit occurred during the SARS-CoV-2 public health emergency.  Safety protocols were in place, including screening questions prior to the visit, additional usage of staff PPE, and extensive cleaning of exam room while observing appropriate contact time as indicated for disinfecting solutions.      Chief Complaint  Patient presents with  . Annual Exam    Pt would like to discuss circulation issues and swallowing issues     HPI: Patient  Danielle Graham  54 y.o. comes in today for Preventive Health Care visit  Feels different  At times  When takes deep breath .  Mom got asthma as adult but she has no cough or doe exercise intolerance  May just be  From covid anxiety etc   Swallowing:  Food get stuck   Lodged   Endoscopy about 5 year ago. flovent  Treatment  Advised but didn't do this.  salzmanns  Eye disease sees  opthalm Health Maintenance  Topic Date Due  . PAP SMEAR-Modifier  10/23/2019  . MAMMOGRAM  07/01/2020  . COLONOSCOPY  12/26/2024  . TETANUS/TDAP  05/23/2028  . INFLUENZA VACCINE  Completed  . Hepatitis C Screening  Completed  . HIV Screening  Completed   Health Maintenance Review LIFESTYLE:  Exercise:   Very low  Tobacco/ETS:n Alcohol:  5 per week  Sugar beverages: coffee in am  Sleep:8 Drug use: no HH of  3  nmo pets  Work:[employer] from home  40  Has gyne and nl check up     ROS:  GEN/ HEENT: No fever, significant weight changes sweats headaches vision problems hearing changes, CV/ PULM; No chest pain shortness of breath cough, syncope,edema  change in exercise tolerance. GI /GU: No adominal pain See above  No blood in the stool. No significant GU symptoms. SKIN/HEME: ,no acute skin rashes suspicious lesions or bleeding. No lymphadenopathy, nodules, masses.  NEURO/ PSYCH:  No neurologic signs such as weakness . No depression anxiety. ocass transient tingling fingers toes not assoc sx  IMM/ Allergy: No unusual infections.  Allergy .   REST of 12 system review  negative except as per HPI   Past Medical History:  Diagnosis Date  . Allergic rhinitis, seasonal   . HERPES GENITALIS 11/06/2009   Qualifier: Diagnosis of  By: Lawernce Ion, CMA (AAMA), Bethann Berkshire   . History of chicken pox   . History of colonic polyps    pre cancer  . HSV (herpes simplex virus) infection   . HYPERGLYCEMIA, FASTING 03/22/2010   Qualifier: Diagnosis of  By: Fabian Sharp MD, Neta Mends   . Hyperlipidemia   . NSVD (normal spontaneous vaginal delivery)    x2  . Overactive bladder   . Urinary incontinence     Past Surgical History:  Procedure Laterality Date  . BREAST BIOPSY  11/1999   right lumpectomy..fibroadenoma  . DILATION AND CURETTAGE OF UTERUS     miscarriage 1999..x2  . MYOMECTOMY    . REFRACTIVE SURGERY Left   . TONSILLECTOMY  1971    Family History  Problem Relation Age of Onset  . Asthma Mother   . Colon cancer Father        deceased  . Cancer Father 46       colon  . Heart disease Paternal Grandmother        60 s  . Other Other        ELEVated WBC count in half sister  . Breast cancer Maternal Aunt  Outpatient Medications Prior to Visit  Medication Sig Dispense Refill  . aspirin 81 MG tablet Take 81 mg by mouth daily.     . calcium carbonate (OS-CAL) 1250 MG chewable tablet Chew 1 tablet by mouth daily.      . fish oil-omega-3 fatty acids 1000 MG capsule Take 2 g by mouth daily.      . MULTIPLE VITAMIN PO Take by mouth.       No facility-administered medications prior to visit.     EXAM:  BP 116/62 (BP Location: Right Arm, Patient Position: Sitting, Cuff Size: Normal)   Pulse 66   Temp 97.9 F (36.6 C) (Temporal)   Ht 5\' 4"  (1.626 m)   Wt 142 lb (64.4 kg)   SpO2 97%   BMI 24.37 kg/m   Body mass index is 24.37 kg/m. Wt Readings from Last 3 Encounters:  11/27/19 142 lb (64.4 kg)  11/06/19 142 lb (64.4 kg)  10/22/18 133 lb 3.2 oz (60.4 kg)    Physical Exam: Vital signs reviewed 10/24/18 is a well-developed well-nourished  alert cooperative    who appearsr stated age in no acute distress.  HEENT: normocephalic atraumatic , Eyes: PERRL EOM's full, conjunctiva clear, ., Ears: no deformity EAC's clear TMs with normal landmarks. Mouth masked NECK: supple without masses, thyromegaly or bruits. CHEST/PULM:  Clear to auscultation and percussion breath sounds equal no wheeze , rales or rhonchi. No chest wall deformities or tenderness. Breast: per gyne. CV: PMI is nondisplaced, S1 S2 no gallops, murmurs, rubs. Peripheral pulses are full without delay.No JVD .  ABDOMEN: Bowel sounds normal nontender  No guard or rebound, no hepato splenomegal no CVA tenderness.  Extremtities:  No clubbing cyanosis or edema, no acute joint swelling or redness no focal atrophy NEURO:  Oriented x3, cranial nerves 3-12 appear to be intact, no obvious focal weakness,gait within normal limits no abnormal reflexes or asymmetrical SKIN: No acute rashes normal turgor, color, no bruising or petechiae. PSYCH: Oriented, good eye contact, no obvious depression anxiety, cognition and judgment appear normal. LN: no cervical axillary inguinal adenopathy  Lab Results  Component Value Date   WBC 6.2 09/11/2018   HGB 14.0 09/11/2018   HCT 41.6 09/11/2018   PLT 322.0 09/11/2018   GLUCOSE 89 09/11/2018   CHOL 273 (H) 09/11/2018   TRIG 104.0 09/11/2018   HDL 73.30 09/11/2018   LDLDIRECT 148.9 11/13/2012   LDLCALC 179 (H) 09/11/2018   ALT 13 09/11/2018   AST 17 09/11/2018   NA 139 09/11/2018   K 4.5 09/11/2018   CL 102 09/11/2018   CREATININE 0.82 09/11/2018   BUN 17 09/11/2018   CO2 30 09/11/2018   TSH 2.36 09/11/2018   HGBA1C 5.6 08/23/2017    BP Readings from Last 3 Encounters:  11/27/19 116/62  11/06/19 122/80  10/22/18 106/70    Lab plan  reviewed with patient  Afternoon  Just had coffee today !  ASSESSMENT AND PLAN:  Discussed the following assessment and plan:    ICD-10-CM   1. Visit for preventive health examination  Z00.00  Basic metabolic panel    CBC with Differential/Platelet    Hepatic function panel    Lipid panel    TSH    Hemoglobin A1c  2. Hyperlipidemia, unspecified hyperlipidemia type  E78.5 Basic metabolic panel    CBC with Differential/Platelet    Hepatic function panel    Lipid panel    TSH    Hemoglobin A1c  3. Dysphagia, unspecified type  R13.10     see Dr Collene Mares about this poss EOE by intimation  EOE probabaly Get back if tingling more clincally significant   Patient Care Team: Marisa Hufstetler, Standley Brooking, MD as PCP - General Lavonna Monarch, MD (Dermatology) Juanita Craver, MD as Consulting Physician (Gastroenterology) Princess Bruins, MD as Consulting Physician (Obstetrics and Gynecology) Patient Instructions  Will notify you  of labs when available. And will get form sent in.  Get appt with dr Collene Mares about the esophageal sx .  probably eosinophilic esophagitis.   Let us know if an y concerning sx  But your exam is good today .    Health Maintenance, Female Adopting a healthy lifestyle and getting preventive care are important in promoting health and wellness. Ask your health care provider about:  The right schedule for you to have regular tests and exams.  Things you can do on your own to prevent diseases and keep yourself healthy. What should I know about diet, weight, and exercise? Eat a healthy diet   Eat a diet that includes plenty of vegetables, fruits, low-fat dairy products, and lean protein.  Do not eat a lot of foods that are high in solid fats, added sugars, or sodium. Maintain a healthy weight Body mass index (BMI) is used to identify weight problems. It estimates body fat based on height and weight. Your health care provider can help determine your BMI and help you achieve or maintain a healthy weight. Get regular exercise Get regular exercise. This is one of the most important things you can do for your health. Most adults should:  Exercise for at least 150 minutes each  week. The exercise should increase your heart rate and make you sweat (moderate-intensity exercise).  Do strengthening exercises at least twice a week. This is in addition to the moderate-intensity exercise.  Spend less time sitting. Even light physical activity can be beneficial. Watch cholesterol and blood lipids Have your blood tested for lipids and cholesterol at 56 years of age, then have this test every 5 years. Have your cholesterol levels checked more often if:  Your lipid or cholesterol levels are high.  You are older than 56 years of age.  You are at high risk for heart disease. What should I know about cancer screening? Depending on your health history and family history, you may need to have cancer screening at various ages. This may include screening for:  Breast cancer.  Cervical cancer.  Colorectal cancer.  Skin cancer.  Lung cancer. What should I know about heart disease, diabetes, and high blood pressure? Blood pressure and heart disease  High blood pressure causes heart disease and increases the risk of stroke. This is more likely to develop in people who have high blood pressure readings, are of African descent, or are overweight.  Have your blood pressure checked: ? Every 3-5 years if you are 51-56 years of age. ? Every year if you are 16 years old or older. Diabetes Have regular diabetes screenings. This checks your fasting blood sugar level. Have the screening done:  Once every three years after age 50 if you are at a normal weight and have a low risk for diabetes.  More often and at a younger age if you are overweight or have a high risk for diabetes. What should I know about preventing infection? Hepatitis B If you have a higher risk for hepatitis B, you should be screened for this virus. Talk with your health care provider to find out if  you are at risk for hepatitis B infection. Hepatitis C Testing is recommended for:  Everyone born from [birth year]  through 1965.  Anyone with known risk factors for hepatitis C. Sexually transmitted infections (STIs)  Get screened for STIs, including gonorrhea and chlamydia, if: ? You are sexually active and are younger than 56 years of age. ? You are older than 56 years of age and your health care provider tells you that you are at risk for this type of infection. ? Your sexual activity has changed since you were last screened, and you are at increased risk for chlamydia or gonorrhea. Ask your health care provider if you are at risk.  Ask your health care provider about whether you are at high risk for HIV. Your health care provider may recommend a prescription medicine to help prevent HIV infection. If you choose to take medicine to prevent HIV, you should first get tested for HIV. You should then be tested every 3 months for as long as you are taking the medicine. Pregnancy  If you are about to stop having your period (premenopausal) and you may become pregnant, seek counseling before you get pregnant.  Take 400 to 800 micrograms (mcg) of folic acid every day if you become pregnant.  Ask for birth control (contraception) if you want to prevent pregnancy. Osteoporosis and menopause Osteoporosis is a disease in which the bones lose minerals and strength with aging. This can result in bone fractures. If you are 71 years old or older, or if you are at risk for osteoporosis and fractures, ask your health care provider if you should:  Be screened for bone loss.  Take a calcium or vitamin D supplement to lower your risk of fractures.  Be given hormone replacement therapy (HRT) to treat symptoms of menopause. Follow these instructions at home: Lifestyle  Do not use any products that contain nicotine or tobacco, such as cigarettes, e-cigarettes, and chewing tobacco. If you need help quitting, ask your health care provider.  Do not use street drugs.  Do not share needles.  Ask your health care  provider for help if you need support or information about quitting drugs. Alcohol use  Do not drink alcohol if: ? Your health care provider tells you not to drink. ? You are pregnant, may be pregnant, or are planning to become pregnant.  If you drink alcohol: ? Limit how much you use to 0-1 drink a day. ? Limit intake if you are breastfeeding.  Be aware of how much alcohol is in your drink. In the [place] one drink equals one 12 oz bottle of beer (355 mL), one 5 oz glass of wine (148 mL), or one 1 oz glass of hard liquor (44 mL). General instructions  Schedule regular health, dental, and eye exams.  Stay current with your vaccines.  Tell your health care provider if: ? You often feel depressed. ? You have ever been abused or do not feel safe at home. Summary  Adopting a healthy lifestyle and getting preventive care are important in promoting health and wellness.  Follow your health care provider's instructions about healthy diet, exercising, and getting tested or screened for diseases.  Follow your health care provider's instructions on monitoring your cholesterol and blood pressure. This information is not intended to replace advice given to you by your health care provider. Make sure you discuss any questions you have with your health care provider. Document Revised: 10/10/2018 Document Reviewed: 10/10/2018 Elsevier Patient Education  2020 Elsevier  Lavelle Kirsi Hugh M.D.

## 2019-11-28 ENCOUNTER — Other Ambulatory Visit: Payer: Self-pay

## 2019-11-28 DIAGNOSIS — E785 Hyperlipidemia, unspecified: Secondary | ICD-10-CM

## 2019-11-28 NOTE — Progress Notes (Signed)
Cholesterol is very high  in the range where we use medication Rest is ok .  We would add  statin medicine  based on guidelines if cannot reduce this  level with healthier eating and exercise . ( check american heart association or [place] [employer])  and consider other risk assessment to decide  Please arrange  lipid panel in 3-4 months   then video visit to discuss  . If you want to begin statin medication in the interim  let me know and we can arrange visit to discuss.

## 2020-01-24 HISTORY — PX: OTHER SURGICAL HISTORY: SHX169
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
## 2020-02-24 ENCOUNTER — Other Ambulatory Visit: Payer: 59

## 2020-02-26 ENCOUNTER — Telehealth: Payer: 59 | Admitting: Internal Medicine

## 2020-02-28 ENCOUNTER — Other Ambulatory Visit: Payer: Self-pay

## 2020-02-28 ENCOUNTER — Telehealth: Payer: Self-pay | Admitting: Internal Medicine

## 2020-02-28 NOTE — Telephone Encounter (Signed)
Called patient and gave her the message from Dr. Fabian Sharp. Patient stated that she will just have the labs done at Dr. Trilby Drummer office since she will be there next Tuesday. Patient verbalized an understanding.

## 2020-02-28 NOTE — Telephone Encounter (Addendum)
Pt would like to be tested for a food allergy when she is here for her lab work on May 3rd this coming Monday per Dr. Trilby Drummer. The reason why is b/c Dr. Trilby Drummer believe she has Eosinophilic due to her white blood counts being high    Pt can be reached at [phone]

## 2020-02-28 NOTE — Telephone Encounter (Signed)
Dont  have enough information  There are many kinds of tests for allergy    And sometimes better to see an allergist If you have documented EOE  I suggest  Delay the blood test  Until deciding best path  Get dr Trilby Drummer note or orders to [place]    Then may be virtual visit  To get best evaluation

## 2020-02-28 NOTE — Telephone Encounter (Signed)
Please see message. °

## 2020-03-02 ENCOUNTER — Other Ambulatory Visit: Payer: Self-pay

## 2020-03-02 ENCOUNTER — Other Ambulatory Visit (INDEPENDENT_AMBULATORY_CARE_PROVIDER_SITE_OTHER): Payer: 59

## 2020-03-02 DIAGNOSIS — E785 Hyperlipidemia, unspecified: Secondary | ICD-10-CM | POA: Diagnosis not present

## 2020-03-02 LAB — LIPID PANEL
Cholesterol: 315 mg/dL — ABNORMAL HIGH (ref 0–200)
HDL: 69.1 mg/dL (ref >39.00)
LDL Cholesterol: 222 mg/dL — ABNORMAL HIGH (ref 0–99)
NonHDL: 245.58
Total CHOL/HDL Ratio: 5
Triglycerides: 119 mg/dL (ref 0.0–149.0)
VLDL: 23.8 mg/dL (ref 0.0–40.0)

## 2020-03-02 NOTE — Progress Notes (Signed)
Still very high cholesterol  we will discuss medication intervention at your upcoming visit

## 2020-03-09 ENCOUNTER — Encounter: Payer: Self-pay | Admitting: Internal Medicine

## 2020-03-09 ENCOUNTER — Other Ambulatory Visit: Payer: Self-pay

## 2020-03-09 ENCOUNTER — Telehealth (INDEPENDENT_AMBULATORY_CARE_PROVIDER_SITE_OTHER): Payer: 59 | Admitting: Internal Medicine

## 2020-03-09 VITALS — Ht 64.0 in | Wt 140.0 lb

## 2020-03-09 DIAGNOSIS — K200 Eosinophilic esophagitis: Secondary | ICD-10-CM

## 2020-03-09 DIAGNOSIS — E785 Hyperlipidemia, unspecified: Secondary | ICD-10-CM | POA: Diagnosis not present

## 2020-03-09 DIAGNOSIS — Z79899 Other long term (current) drug therapy: Secondary | ICD-10-CM

## 2020-03-09 MED ORDER — ROSUVASTATIN CALCIUM 10 MG PO TABS: 10.0000 mg | ORAL_TABLET | Freq: Every day | ORAL | 3 refills | Status: DC

## 2020-03-09 NOTE — Progress Notes (Signed)
Virtual Visit via Video Note  I connected with@ on 03/09/20 at  8:30 AM EDT by a video enabled telemedicine application and verified that I am speaking with the correct person using two identifiers. Location patient: home Location provider:work  office Persons participating in the virtual visit: patient, provider  WIth national recommendations  regarding COVID 19 pandemic   video visit is advised over in office visit for this patient.  Patient aware  of the limitations of evaluation and management by telemedicine and  availability of in person appointments. and agreed to proceed.   HPI: Danielle Graham presents for video visit  For f/u    elevatd Lipids   consistent since January  Has had eleated lipids but not this high    Her mom  Age 56 on statin medication   A gm died of MI in 107s . She idi a life screen recently apparently no inc risk   New diagnosis  Per Dr Collene Mares :  Linton Flemings   And now on flovent  Inhaler  And omeprazole  Food allergy test  Pending   Dr Collene Mares   Plans on working on [employer]  Look into diet  ROS: See pertinent positives and negatives per HPI. Has mild cold today doing well  Has had  Moderna covid series  Past Medical History:  Diagnosis Date  . Allergic rhinitis, seasonal   . HERPES GENITALIS 11/06/2009   Qualifier: Diagnosis of  By: Hulan Saas, CMA (AAMA), Quita Skye   . History of chicken pox   . History of colonic polyps    pre cancer  . HSV (herpes simplex virus) infection   . HYPERGLYCEMIA, FASTING 03/22/2010   Qualifier: Diagnosis of  By: Regis Bill MD, Standley Brooking   . Hyperlipidemia   . NSVD (normal spontaneous vaginal delivery)    x2  . Overactive bladder   . Urinary incontinence     Past Surgical History:  Procedure Laterality Date  . BREAST BIOPSY  11/1999   right lumpectomy..fibroadenoma  . COLONOSCOPY  March 26th, 2021  . DILATION AND CURETTAGE OF UTERUS     miscarriage 1999..x2  . Eosinophilic Esophagitis Biopsy  01/24/2020   [facility]  .  MYOMECTOMY    . REFRACTIVE SURGERY Left   . TONSILLECTOMY  1971  . UPPER GI ENDOSCOPY  March 26th, 2021    Family History  Problem Relation Age of Onset  . Asthma Mother   . Colon cancer Father        deceased  . Cancer Father 58       colon  . Heart disease Paternal Grandmother        13 s  . Other Other        ELEVated WBC count in half sister  . Breast cancer Maternal Aunt     Social History   Tobacco Use  . Smoking status: Former Smoker    Quit date: 09/27/2008    Years since quitting: 11.4  . Smokeless tobacco: Never Used  Substance Use Topics  . Alcohol use: Yes    Alcohol/week: 0.0 standard drinks    Comment: weekend only...3 drinks  . Drug use: No      Current Outpatient Medications:  .  aspirin 81 MG tablet, Take 81 mg by mouth daily. , Disp: , Rfl:  .  calcium carbonate (OS-CAL) 1250 MG chewable tablet, Chew 1 tablet by mouth daily.  , Disp: , Rfl:  .  fish oil-omega-3 fatty acids 1000 MG capsule,  Take 2 g by mouth daily.  , Disp: , Rfl:  .  FLOVENT HFA 110 MCG/ACT inhaler, Inhale 2 puffs into the lungs 2 (two) times daily., Disp: , Rfl:  .  MULTIPLE VITAMIN PO, Take by mouth.  , Disp: , Rfl:  .  omeprazole (PRILOSEC) 40 MG capsule, Take 40 mg by mouth daily., Disp: , Rfl:  .  rosuvastatin (CRESTOR) 10 MG tablet, Take 1 tablet (10 mg total) by mouth daily., Disp: 90 tablet, Rfl: 3  EXAM: BP Readings from Last 3 Encounters:  11/27/19 116/62  11/06/19 122/80  10/22/18 106/70    VITALS per patient if applicable:  GENERAL: alert, oriented, appears well and in no acute distress  HEENT: atraumatic, conjunttiva clear, no obvious abnormalities on inspection of external nose and ears  NECK: normal movements of the head and neck  LUNGS: on inspection no signs of respiratory distress, breathing rate appears normal, no obvious gross SOB, gasping or wheezing  CV: no obvious cyanosis   PSYCH/NEURO: pleasant and cooperative, no obvious depression or  anxiety, speech and thought processing grossly intact Lab Results  Component Value Date   WBC 5.4 11/27/2019   HGB 13.9 11/27/2019   HCT 41.7 11/27/2019   PLT 319.0 11/27/2019   GLUCOSE 87 11/27/2019   CHOL 315 (H) 03/02/2020   TRIG 119.0 03/02/2020   HDL 69.10 03/02/2020   LDLDIRECT 148.9 11/13/2012   LDLCALC 222 (H) 03/02/2020   ALT 13 11/27/2019   AST 17 11/27/2019   NA 137 11/27/2019   K 4.2 11/27/2019   CL 101 11/27/2019   CREATININE 0.74 11/27/2019   BUN 12 11/27/2019   CO2 30 11/27/2019   TSH 2.02 11/27/2019   HGBA1C 5.8 11/27/2019    ASSESSMENT AND PLAN:    Discussed the following assessment and plan:    ICD-10-CM   1. Hyperlipidemia, unspecified hyperlipidemia type  E78.5 Hepatic function panel    Lipid panel  2. Medication management  Z79.899 Hepatic function panel    Lipid panel   primary prevention  with meds advised   3. Eosinophilic esophagitis  K20.0    new  rx    prob familial and  Inc   During pandemic from ls changes  She plans on attending to improvement lsi  Begin statin medication Crestor 10    May need ramping up of dose to 20  Mg  As tolerated to get 50 % reduction as possible  Counseled.   Expectant management and discussion of plan and treatment with opportunity to ask questions and all were answered. The patient agreed with the plan and demonstrated an understanding of the instructions.   Advised to call back or seek an in-person evaluation if worsening  or having  further concerns . Return for fasting  lab in about 3 months.    Berniece Andreas, MD

## 2020-07-07 LAB — HM MAMMOGRAPHY

## 2020-08-03 ENCOUNTER — Telehealth: Payer: Self-pay | Admitting: Internal Medicine

## 2020-08-03 NOTE — Telephone Encounter (Signed)
Can you check to see if she needs labs? Not sure if what is ordered is old or I she needs more than what is ordered?

## 2020-08-03 NOTE — Telephone Encounter (Signed)
pt would like an order for lab work; she said she was told to call back and make an appt for her labs  [phone]

## 2020-08-03 NOTE — Telephone Encounter (Signed)
future orders are in th system     Lipid panel and lfts   No others needed.  Marland Kitchen

## 2020-08-04 NOTE — Telephone Encounter (Signed)
Called patient and scheduled her for a lab appointment on Tuesday, October 12th at 7:20am. Patient verbalized an understanding.

## 2020-08-06 NOTE — Addendum Note (Signed)
Addended by: Lerry Liner on: 08/06/2020 09:56 AM   Modules accepted: Orders

## 2020-08-11 ENCOUNTER — Other Ambulatory Visit: Payer: 59

## 2020-08-14 ENCOUNTER — Telehealth: Payer: Self-pay

## 2020-08-14 NOTE — Telephone Encounter (Signed)
Patient called in wanting to let Dr know she has a cough and cold for about a week with sinus congestion. Patient has been taking Dayquil and Nyquil for about a week and it started to get better but today she's had a lot of cough with chest congestion. Wants to know what else she can possible take OTC    Please call and advise

## 2020-08-14 NOTE — Telephone Encounter (Signed)
I dont have a lot of information  to answer fully  But  Try mucinex DM for cough and chest .  If dyspnea  Sig fever problem  Seek other care    If  . persistent or progressive

## 2020-08-15 NOTE — Telephone Encounter (Signed)
Spoke with the pt and informed her of the message below.  Patient declines fever or shortness of breath and stated she felt she just had a "lingering cold".  Stated she has started Mucinex DM as recommended by the pharmacist and prefers to call back next week if needed.  Patient asked if she could take Dayquil and Mucinex at the same time.  I advised her to check the ingredientsto make sure they are not the same and contact the pharmacy if needed and she agreed.  Message sent to PCP.

## 2020-09-17 ENCOUNTER — Telehealth: Payer: Self-pay

## 2020-09-17 ENCOUNTER — Encounter: Payer: Self-pay | Admitting: Obstetrics & Gynecology

## 2020-09-17 ENCOUNTER — Other Ambulatory Visit: Payer: Self-pay

## 2020-09-17 ENCOUNTER — Ambulatory Visit (INDEPENDENT_AMBULATORY_CARE_PROVIDER_SITE_OTHER): Payer: 59 | Admitting: Obstetrics & Gynecology

## 2020-09-17 VITALS — BP 128/70

## 2020-09-17 DIAGNOSIS — M79621 Pain in right upper arm: Secondary | ICD-10-CM

## 2020-09-17 NOTE — Progress Notes (Signed)
° ° °  Danielle Graham 14-Jan-1966 [id number]        54 y.o.  [id number]   RP: Rt>Lt axillary tenderness x 2 weeks  HPI: Rt>Lt axillary tenderness x 2 weeks.  No lump at axillae or breasts felt.  No nipple d/c.  No skin change noted.  No fever.  No breast trauma, no change in physical activities.  Screening mammo neg 07/2020.  Aunt with Breast Ca.     OB History  Gravida Para Term Preterm AB Living  4 2 2   2 2   SAB TAB Ectopic Multiple Live Births  2       2    # Outcome Date GA Lbr Len/2nd Weight Sex Delivery Anes PTL Lv  4 SAB           3 SAB           2 Term     M Vag-Spont  N LIV  1 Term     F Vag-Spont  N LIV    Past medical history,surgical history, problem list, medications, allergies, family history and social history were all reviewed and documented in the EPIC chart.   Directed ROS with pertinent positives and negatives documented in the history of present illness/assessment and plan.  Exam:  Vitals:   09/17/20 1458  BP: 128/70   General appearance:  Normal  Breast exam: Bilateral breast exam normal.  Mildly tender at the axilla right more than left.  No lymph node felt.  Skin normal.   Assessment/Plan:  54 y.o. 59   1. Tenderness of right axilla Tenderness at right axilla more than left.  No lymph node felt.  Skin normal.  Bilateral breast exam normal.  Decision to proceed with a bilateral diagnostic mammogram with ultrasound.  Patient agrees with plan.  [id number] MD, 3:20 PM 09/17/2020

## 2020-09-17 NOTE — Telephone Encounter (Signed)
Patient called complaining of tenderness in her armpit. Uncomfortable putting deodorant on. She does not feel anything but has concern it might be a lymph node. I recommended office visit to let Dr. Mackey Birchwood examine. Appt scheduled.

## 2020-09-21 ENCOUNTER — Telehealth: Payer: Self-pay | Admitting: *Deleted

## 2020-09-21 NOTE — Telephone Encounter (Signed)
Patient scheduled on 10/08/20 @ 8:15am order faxed to [facility] at [phone]. Patient informed.

## 2020-09-21 NOTE — Telephone Encounter (Signed)
-----   Message from Genia Del, MD sent at 09/17/2020  3:25 PM EST ----- Regarding: Schedule Bilateral Dx mammo/US at [facility] Rt>Lt axillary tenderness x 2 weeks.  No axillary LN felt.  Rt breast erythema (3 x 3 cm) of skin at 9 O'Clock with mild induration.  Insect bite?  Other etiology.  Schedule Bilateral Dx mammo/US at [facility]

## 2020-09-23 ENCOUNTER — Encounter: Payer: Self-pay | Admitting: Obstetrics & Gynecology

## 2020-10-29 ENCOUNTER — Ambulatory Visit: Payer: 59 | Attending: Internal Medicine

## 2020-10-29 DIAGNOSIS — Z23 Encounter for immunization: Secondary | ICD-10-CM

## 2020-10-29 NOTE — Progress Notes (Signed)
   Covid-19 Vaccination Clinic  Name:  Danielle Graham    MRN: [id number] DOB: 01-21-1966  10/29/2020  Danielle Graham was observed post Covid-19 immunization for 15 minutes without incident. She was provided with Vaccine Information Sheet and instruction to access the V-Safe system.   Danielle Graham was instructed to call 911 with any severe reactions post vaccine: Marland Kitchen Difficulty breathing  . Swelling of face and throat  . A fast heartbeat  . A bad rash all over body  . Dizziness and weakness   Immunizations Administered    Name Date Dose VIS Date Route   Moderna Covid-19 Booster Vaccine 10/29/2020  3:05 PM 0.25 mL 08/19/2020 Intramuscular   Manufacturer: Moderna   Lot: [id number]   NDC: 59458-592-92

## 2020-11-06 ENCOUNTER — Encounter: Payer: 59 | Admitting: Obstetrics & Gynecology

## 2020-12-11 ENCOUNTER — Other Ambulatory Visit: Payer: Self-pay

## 2020-12-11 ENCOUNTER — Ambulatory Visit (INDEPENDENT_AMBULATORY_CARE_PROVIDER_SITE_OTHER): Payer: 59 | Admitting: Obstetrics & Gynecology

## 2020-12-11 ENCOUNTER — Encounter: Payer: Self-pay | Admitting: Obstetrics & Gynecology

## 2020-12-11 VITALS — BP 128/80 | Ht 64.0 in | Wt 139.0 lb

## 2020-12-11 DIAGNOSIS — Z780 Asymptomatic menopausal state: Secondary | ICD-10-CM

## 2020-12-11 DIAGNOSIS — Z01419 Encounter for gynecological examination (general) (routine) without abnormal findings: Secondary | ICD-10-CM | POA: Diagnosis not present

## 2020-12-11 DIAGNOSIS — Z789 Other specified health status: Secondary | ICD-10-CM | POA: Diagnosis not present

## 2020-12-11 DIAGNOSIS — Z9889 Other specified postprocedural states: Secondary | ICD-10-CM | POA: Diagnosis not present

## 2020-12-11 NOTE — Progress Notes (Signed)
Danielle Graham 1965/12/22 [id number]   History:    54 y.o. [id number] Married. Vasectomy  CZ:[id number] presenting for annual gyn exam   DUK:[id number], well on no HRT.  No PMB..  Mild occasional night sweats. No pelvic pain. No pain with IC, but some dryness, using [place]. H/O LEEP in 2005, margins negative.Urine normal. Bowel movements normal. Breasts normal. Body mass index 23.86. Health labs with family physician. Hypercholesterolemia on a Statin.  Past medical history,surgical history, family history and social history were all reviewed and documented in the EPIC chart.  Gynecologic History No LMP recorded. Patient is premenopausal.  Obstetric History OB History  Gravida Para Term Preterm AB Living  4 2 2   2 2   SAB IAB Ectopic Multiple Live Births  2       2    # Outcome Date GA Lbr Len/2nd Weight Sex Delivery Anes PTL Lv  4 SAB           3 SAB           2 Term     M Vag-Spont  N LIV  1 Term     F Vag-Spont  N LIV     ROS: A ROS was performed and pertinent positives and negatives are included in the history.  GENERAL: No fevers or chills. HEENT: No change in vision, no earache, sore throat or sinus congestion. NECK: No pain or stiffness. CARDIOVASCULAR: No chest pain or pressure. No palpitations. PULMONARY: No shortness of breath, cough or wheeze. GASTROINTESTINAL: No abdominal pain, nausea, vomiting or diarrhea, melena or bright red blood per rectum. GENITOURINARY: No urinary frequency, urgency, hesitancy or dysuria. MUSCULOSKELETAL: No joint or muscle pain, no back pain, no recent trauma. DERMATOLOGIC: No rash, no itching, no lesions. ENDOCRINE: No polyuria, polydipsia, no heat or cold intolerance. No recent change in weight. HEMATOLOGICAL: No anemia or easy bruising or bleeding. NEUROLOGIC: No headache, seizures, numbness, tingling or weakness. PSYCHIATRIC: No depression, no loss of interest in normal activity or change in sleep pattern.      Exam:   BP 128/80 (BP Location: Right Arm, Patient Position: Sitting, Cuff Size: Normal)   Ht 5\' 4"  (1.626 m)   Wt 139 lb (63 kg)   BMI 23.86 kg/m   Body mass index is 23.86 kg/m.  General appearance : Well developed well nourished female. No acute distress HEENT: Eyes: no retinal hemorrhage or exudates,  Neck supple, trachea midline, no carotid bruits, no thyroidmegaly Lungs: Clear to auscultation, no rhonchi or wheezes, or rib retractions  Heart: Regular rate and rhythm, no murmurs or gallops Breast:Examined in sitting and supine position were symmetrical in appearance, no palpable masses or tenderness,  no skin retraction, no nipple inversion, no nipple discharge, no skin discoloration, no axillary or supraclavicular lymphadenopathy Abdomen: no palpable masses or tenderness, no rebound or guarding Extremities: no edema or skin discoloration or tenderness  Pelvic: Vulva: Normal             Vagina: No gross lesions or discharge  Cervix: No gross lesions or discharge.  Pap reflex done.  Uterus  AV, normal size, shape and consistency, non-tender and mobile  Adnexa  Without masses or tenderness  Anus: Normal.  Rectal exam Normal.   Assessment/Plan:  55 y.o. female for annual exam   1. Encounter for routine gynecological examination with Papanicolaou smear of cervix Normal gynecologic exam in menopause.  Pap reflex done.  Breast exam normal.  Screening mammogram September 2021 was negative.  Dx Rt mammo/US benign 09/2020.  Colonoscopy in 2021.  Health labs with family physician. Hypercholesterolemia on a statin.  Good body mass index at 23.86.  Continue with healthy nutrition and fitness. - Pap IG w/ reflex to HPV when ASC-U  2. H/O LEEP In 2005, margins negative.  3. Relies on vasectomy as primary birth control method  4. Postmenopause Well on no HRT.  No PMB.  Vit D supplements, Ca++ 1.5 g/d, regular weight bearing physical activities.  Genia Del MD, 4:15 PM  12/11/2020

## 2020-12-14 LAB — PAP IG W/ RFLX HPV ASCU

## 2020-12-21 ENCOUNTER — Encounter: Payer: Self-pay | Admitting: *Deleted

## 2021-03-01 ENCOUNTER — Other Ambulatory Visit: Payer: Self-pay | Admitting: Internal Medicine

## 2021-03-03 ENCOUNTER — Encounter: Payer: Self-pay | Admitting: Internal Medicine

## 2021-03-03 ENCOUNTER — Other Ambulatory Visit: Payer: Self-pay

## 2021-03-03 ENCOUNTER — Ambulatory Visit (INDEPENDENT_AMBULATORY_CARE_PROVIDER_SITE_OTHER): Payer: 59 | Admitting: Internal Medicine

## 2021-03-03 VITALS — BP 116/66 | HR 75 | Temp 98.1°F | Ht 63.75 in | Wt 139.6 lb

## 2021-03-03 DIAGNOSIS — E785 Hyperlipidemia, unspecified: Secondary | ICD-10-CM

## 2021-03-03 DIAGNOSIS — Z79899 Other long term (current) drug therapy: Secondary | ICD-10-CM | POA: Diagnosis not present

## 2021-03-03 DIAGNOSIS — Z0000 Encounter for general adult medical examination without abnormal findings: Secondary | ICD-10-CM

## 2021-03-03 DIAGNOSIS — R5383 Other fatigue: Secondary | ICD-10-CM

## 2021-03-03 LAB — LIPID PANEL
Cholesterol: 210 mg/dL — ABNORMAL HIGH (ref 0–200)
HDL: 76.8 mg/dL
LDL Cholesterol: 110 mg/dL — ABNORMAL HIGH (ref 0–99)
NonHDL: 133.44
Total CHOL/HDL Ratio: 3
Triglycerides: 118 mg/dL (ref 0.0–149.0)
VLDL: 23.6 mg/dL (ref 0.0–40.0)

## 2021-03-03 LAB — BASIC METABOLIC PANEL WITH GFR
BUN: 16 mg/dL (ref 6–23)
CO2: 30 meq/L (ref 19–32)
Calcium: 9.7 mg/dL (ref 8.4–10.5)
Chloride: 104 meq/L (ref 96–112)
Creatinine, Ser: 0.77 mg/dL (ref 0.40–1.20)
GFR: 85.74 mL/min
Glucose, Bld: 100 mg/dL — ABNORMAL HIGH (ref 70–99)
Potassium: 4.5 meq/L (ref 3.5–5.1)
Sodium: 141 meq/L (ref 135–145)

## 2021-03-03 LAB — CBC WITH DIFFERENTIAL/PLATELET
Basophils Absolute: 0.1 10*3/uL (ref 0.0–0.1)
Basophils Relative: 1 % (ref 0.0–3.0)
Eosinophils Absolute: 0.2 10*3/uL (ref 0.0–0.7)
Eosinophils Relative: 4.5 % (ref 0.0–5.0)
HCT: 39.1 % (ref 36.0–46.0)
Hemoglobin: 13.1 g/dL (ref 12.0–15.0)
Lymphocytes Relative: 31.7 % (ref 12.0–46.0)
Lymphs Abs: 1.6 10*3/uL (ref 0.7–4.0)
MCHC: 33.4 g/dL (ref 30.0–36.0)
MCV: 90.3 fl (ref 78.0–100.0)
Monocytes Absolute: 0.5 10*3/uL (ref 0.1–1.0)
Monocytes Relative: 9.7 % (ref 3.0–12.0)
Neutro Abs: 2.7 10*3/uL (ref 1.4–7.7)
Neutrophils Relative %: 53.1 % (ref 43.0–77.0)
Platelets: 304 10*3/uL (ref 150.0–400.0)
RBC: 4.34 Mil/uL (ref 3.87–5.11)
RDW: 14.2 % (ref 11.5–15.5)
WBC: 5.1 10*3/uL (ref 4.0–10.5)

## 2021-03-03 LAB — HEPATIC FUNCTION PANEL
ALT: 19 U/L (ref 0–35)
AST: 17 U/L (ref 0–37)
Albumin: 4.5 g/dL (ref 3.5–5.2)
Alkaline Phosphatase: 65 U/L (ref 39–117)
Bilirubin, Direct: 0.1 mg/dL (ref 0.0–0.3)
Total Bilirubin: 0.8 mg/dL (ref 0.2–1.2)
Total Protein: 6.7 g/dL (ref 6.0–8.3)

## 2021-03-03 LAB — TSH: TSH: 1.32 u[IU]/mL (ref 0.35–4.50)

## 2021-03-03 LAB — T4, FREE: Free T4: 0.82 ng/dL (ref 0.60–1.60)

## 2021-03-03 NOTE — Patient Instructions (Addendum)
Optimize lifestyle some exercise outside time Up to my sleeve as possible Your exam is normal today and reassuring. We will check your cholesterol and metabolic factors. If difficulties are continuing and progressing come back for reevaluation discussion.   Health Maintenance, Female Adopting a healthy lifestyle and getting preventive care are important in promoting health and wellness. Ask your health care provider about:  The right schedule for you to have regular tests and exams.  Things you can do on your own to prevent diseases and keep yourself healthy. What should I know about diet, weight, and exercise? Eat a healthy diet  Eat a diet that includes plenty of vegetables, fruits, low-fat dairy products, and lean protein.  Do not eat a lot of foods that are high in solid fats, added sugars, or sodium.   Maintain a healthy weight Body mass index (BMI) is used to identify weight problems. It estimates body fat based on height and weight. Your health care provider can help determine your BMI and help you achieve or maintain a healthy weight. Get regular exercise Get regular exercise. This is one of the most important things you can do for your health. Most adults should:  Exercise for at least 150 minutes each week. The exercise should increase your heart rate and make you sweat (moderate-intensity exercise).  Do strengthening exercises at least twice a week. This is in addition to the moderate-intensity exercise.  Spend less time sitting. Even light physical activity can be beneficial. Watch cholesterol and blood lipids Have your blood tested for lipids and cholesterol at 57 years of age, then have this test every 5 years. Have your cholesterol levels checked more often if:  Your lipid or cholesterol levels are high.  You are older than 57 years of age.  You are at high risk for heart disease. What should I know about cancer screening? Depending on your health history and  family history, you may need to have cancer screening at various ages. This may include screening for:  Breast cancer.  Cervical cancer.  Colorectal cancer.  Skin cancer.  Lung cancer. What should I know about heart disease, diabetes, and high blood pressure? Blood pressure and heart disease  High blood pressure causes heart disease and increases the risk of stroke. This is more likely to develop in people who have high blood pressure readings, are of African descent, or are overweight.  Have your blood pressure checked: ? Every 3-5 years if you are 39-58 years of age. ? Every year if you are 91 years old or older. Diabetes Have regular diabetes screenings. This checks your fasting blood sugar level. Have the screening done:  Once every three years after age 7 if you are at a normal weight and have a low risk for diabetes.  More often and at a younger age if you are overweight or have a high risk for diabetes. What should I know about preventing infection? Hepatitis B If you have a higher risk for hepatitis B, you should be screened for this virus. Talk with your health care provider to find out if you are at risk for hepatitis B infection. Hepatitis C Testing is recommended for:  Everyone born from [birth year] through 1967.  Anyone with known risk factors for hepatitis C. Sexually transmitted infections (STIs)  Get screened for STIs, including gonorrhea and chlamydia, if: ? You are sexually active and are younger than 57 years of age. ? You are older than 57 years of age and your health  care provider tells you that you are at risk for this type of infection. ? Your sexual activity has changed since you were last screened, and you are at increased risk for chlamydia or gonorrhea. Ask your health care provider if you are at risk.  Ask your health care provider about whether you are at high risk for HIV. Your health care provider may recommend a prescription medicine to help prevent  HIV infection. If you choose to take medicine to prevent HIV, you should first get tested for HIV. You should then be tested every 3 months for as long as you are taking the medicine. Pregnancy  If you are about to stop having your period (premenopausal) and you may become pregnant, seek counseling before you get pregnant.  Take 400 to 800 micrograms (mcg) of folic acid every day if you become pregnant.  Ask for birth control (contraception) if you want to prevent pregnancy. Osteoporosis and menopause Osteoporosis is a disease in which the bones lose minerals and strength with aging. This can result in bone fractures. If you are 62 years old or older, or if you are at risk for osteoporosis and fractures, ask your health care provider if you should:  Be screened for bone loss.  Take a calcium or vitamin D supplement to lower your risk of fractures.  Be given hormone replacement therapy (HRT) to treat symptoms of menopause. Follow these instructions at home: Lifestyle  Do not use any products that contain nicotine or tobacco, such as cigarettes, e-cigarettes, and chewing tobacco. If you need help quitting, ask your health care provider.  Do not use street drugs.  Do not share needles.  Ask your health care provider for help if you need support or information about quitting drugs. Alcohol use  Do not drink alcohol if: ? Your health care provider tells you not to drink. ? You are pregnant, may be pregnant, or are planning to become pregnant.  If you drink alcohol: ? Limit how much you use to 0-1 drink a day. ? Limit intake if you are breastfeeding.  Be aware of how much alcohol is in your drink. In the [place] one drink equals one 12 oz bottle of beer (355 mL), one 5 oz glass of wine (148 mL), or one 1 oz glass of hard liquor (44 mL). General instructions  Schedule regular health, dental, and eye exams.  Stay current with your vaccines.  Tell your health care provider if: ? You  often feel depressed. ? You have ever been abused or do not feel safe at home. Summary  Adopting a healthy lifestyle and getting preventive care are important in promoting health and wellness.  Follow your health care provider's instructions about healthy diet, exercising, and getting tested or screened for diseases.  Follow your health care provider's instructions on monitoring your cholesterol and blood pressure. This information is not intended to replace advice given to you by your health care provider. Make sure you discuss any questions you have with your health care provider. Document Revised: 10/10/2018 Document Reviewed: 10/10/2018 Elsevier Patient Education  2021 [employer].

## 2021-03-03 NOTE — Progress Notes (Signed)
Chief Complaint  Patient presents with  . Annual Exam    CPE/labs.  Fasting today.  C/o feeling fatigue, getting distracted more easily.     HPI: Patient  Danielle Graham  55 y.o. comes in today for Preventive Health Care visit and med follow-up.  HLD:  Was taking  crestor and no reported side effects never got back to get her follow-up lipid panel but is doing okay Has noted for a while tired in the afternoon takes a power nap once gets more distracted organizing her thoughts and work. She did have COVID that was minimal 1 day of sore throat and has had 3 vaccines of the Moderna.  But the symptoms started before then. No specific menopausal symptoms although occasionally has sweats at night.  No other associated symptoms.  Working from home in the pandemic there is some stress at times child getting ready to go off to college. Denies specific depression other meds. She is to have eye surgery same for Salzman's nodules Is up-to-date on Pap and mammogram had ultrasound of her breast when she had some axillary pain negative Still taking medicine PPI inhaled steroid for EOE allergy testing was not helpful think she is getting better.  GI follows.  Health Maintenance  Topic Date Due  . PAP SMEAR-Modifier  10/23/2019  . INFLUENZA VACCINE  05/31/2021  . MAMMOGRAM  07/07/2021  . COVID-19 Vaccine (3 - Booster for Moderna series) 08/11/2021  . TETANUS/TDAP  05/23/2028  . COLONOSCOPY (Pts 45-41yrs Insurance coverage will need to be confirmed)  01/23/2030  . Hepatitis C Screening  Completed  . HIV Screening  Completed  . HPV VACCINES  Aged Out   Health Maintenance Review LIFESTYLE:  Exercise:   Minimal    Works from home .  Tobacco/ETS: n Alcohol:  1 per day or qod  Sugar beverages: Sleep:pretty good  8 hours   Awakens   Most nights   Bathrooms  X 1   Some snoring . No osa   Drug use: no HH of   3  No pets  Son.  Work: 8 hours  Had covid pos  Jan from husband  But better  Right away.    ROS:  GEN/ HEENT: No fever, significant weight changes sweats headaches vision problems hearing changes, CV/ PULM; No chest pain shortness of breath cough, syncope,edema  change in exercise tolerance. GI /GU: No adominal pain, vomiting, change in bowel habits. No blood in the stool. No significant GU symptoms. SKIN/HEME: ,no acute skin rashes suspicious lesions or bleeding. No lymphadenopathy, nodules, masses.  NEURO/ PSYCH:  No neurologic signs such as weakness numbness. No depression anxiety. IMM/ Allergy: No unusual infections.  Allergy .   REST of 12 system review negative except as per HPI   Past Medical History:  Diagnosis Date  . Allergic rhinitis, seasonal   . HERPES GENITALIS 11/06/2009   Qualifier: Diagnosis of  By: Lawernce Ion, CMA (AAMA), Bethann Berkshire   . History of chicken pox   . History of colonic polyps    pre cancer  . HSV (herpes simplex virus) infection   . HYPERGLYCEMIA, FASTING 03/22/2010   Qualifier: Diagnosis of  By: Fabian Sharp MD, Neta Mends   . Hyperlipidemia   . NSVD (normal spontaneous vaginal delivery)    x2  . Overactive bladder   . Urinary incontinence     Past Surgical History:  Procedure Laterality Date  . BREAST BIOPSY  11/1999   right lumpectomy..fibroadenoma  . COLONOSCOPY  March 26th, 2021  . DILATION AND CURETTAGE OF UTERUS     miscarriage 1999..x2  . Eosinophilic Esophagitis Biopsy  01/24/2020   [facility]  . MYOMECTOMY    . REFRACTIVE SURGERY Left   . TONSILLECTOMY  1971  . UPPER GI ENDOSCOPY  March 26th, 2021    Family History  Problem Relation Age of Onset  . Asthma Mother   . Colon cancer Father        deceased  . Cancer Father 68       colon  . Heart disease Paternal Grandmother        60 s  . Other Other        ELEVated WBC count in half sister  . Breast cancer Maternal Aunt     Social History   Socioeconomic History  . Marital status: Married    Spouse name: Not on file  . Number of children: Not on file  .  Years of education: Not on file  . Highest education level: Not on file  Occupational History  . Not on file  Tobacco Use  . Smoking status: Former Smoker    Quit date: 09/27/2008    Years since quitting: 12.4  . Smokeless tobacco: Never Used  Vaping Use  . Vaping Use: Never used  Substance and Sexual Activity  . Alcohol use: Yes    Alcohol/week: 0.0 standard drinks    Comment: weekend only...3 drinks  . Drug use: No  . Sexual activity: Yes    Partners: Male    Birth control/protection: None    Comment: 1st intercourse- 15, partners- 2, married- 25 yrs   Other Topics Concern  . 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        Has smoke detector and wears seat belts.  No firearms stored safely. . No excess sun exposure. Sees dentist regularly . No depression   G4P2            Social Determinants of Health   Financial Resource Strain: Not on file  Food Insecurity: Not on file  Transportation Needs: Not on file  Physical Activity: Not on file  Stress: Not on file  Social Connections: Not on file    Outpatient Medications Prior to Visit  Medication Sig Dispense Refill  . aspirin 81 MG tablet Take 81 mg by mouth daily.    . calcium carbonate (OS-CAL) 1250 MG chewable tablet Chew 1 tablet by mouth daily.    . fish oil-omega-3 fatty acids 1000 MG capsule Take 2 g by mouth daily.    Marland Kitchen FLOVENT HFA 110 MCG/ACT inhaler Inhale 2 puffs into the lungs 2 (two) times daily.    . MULTIPLE VITAMIN PO Take by mouth.    Marland Kitchen omeprazole (PRILOSEC) 40 MG capsule Take 40 mg by mouth daily.    . rosuvastatin (CRESTOR) 10 MG tablet TAKE 1 TABLET BY MOUTH EVERY DAY 30 tablet 0   No facility-administered medications prior to visit.     EXAM:  BP 116/66   Pulse 75   Temp 98.1 F (36.7 C) (Oral)   Ht 5' 3.75"  (1.619 m)   Wt 139 lb 9.6 oz (63.3 kg)   SpO2  98%   BMI 24.15 kg/m   Body mass index is 24.15 kg/m. Wt Readings from Last 3 Encounters:  03/03/21 139 lb 9.6 oz (63.3 kg)  12/11/20 139 lb (63 kg)  03/09/20 140 lb (63.5 kg)    Physical Exam: Vital signs reviewed ZOX:WRUEGEN:This is a well-developed well-nourished alert cooperative    who appearsr stated age in no acute distress.  HEENT: normocephalic atraumatic , Eyes: PERRL EOM's full, conjunctiva clear, Nares: paten,t no deformity discharge or tenderness., Ears: no deformity EAC's clear TMs with normal landmarks. Mouth:masked  NECK: supple without masses, thyromegaly or bruits. CHEST/PULM:  Clear to auscultation and percussion breath sounds equal no wheeze , rales or rhonchi. No chest wall deformities or tenderness. Breast: normal by inspection . No dimpling, discharge, masses, tenderness or discharge . CV: PMI is nondisplaced, S1 S2 no gallops, murmurs, rubs. Peripheral pulses are full without delay.No JVD .  ABDOMEN: Bowel sounds normal nontender  No guard or rebound, no hepato splenomegal no CVA tenderness.  No hernia. Extremtities:  No clubbing cyanosis or edema, no acute joint swelling or redness no focal atrophy NEURO:  Oriented x3, cranial nerves 3-12 appear to be intact, no obvious focal weakness,gait within normal limits no abnormal reflexes or asymmetrical SKIN: No acute rashes normal turgor, color, no bruising or petechiae.  Keratosis number of moles` PSYCH: Oriented, good eye contact, no obvious depression anxiety, cognition and judgment appear normal. LN: no cervical axillary inguinal adenopathy  Lab Results  Component Value Date   WBC 5.4 11/27/2019   HGB 13.9 11/27/2019   HCT 41.7 11/27/2019   PLT 319.0 11/27/2019   GLUCOSE 87 11/27/2019   CHOL 315 (H) 03/02/2020   TRIG 119.0 03/02/2020   HDL 69.10 03/02/2020   LDLDIRECT 148.9 11/13/2012   LDLCALC 222 (H) 03/02/2020   ALT 13 11/27/2019   AST 17 11/27/2019   NA 137  11/27/2019   K 4.2 11/27/2019   CL 101 11/27/2019   CREATININE 0.74 11/27/2019   BUN 12 11/27/2019   CO2 30 11/27/2019   TSH 2.02 11/27/2019   HGBA1C 5.8 11/27/2019    BP Readings from Last 3 Encounters:  03/03/21 116/66  12/11/20 128/80  09/17/20 128/70    Lab plan eviewed with patient   ASSESSMENT AND PLAN:  Discussed the following assessment and plan:    ICD-10-CM   1. Visit for preventive health examination  Z00.00 Basic metabolic panel    CBC with Differential/Platelet    Hepatic function panel    Lipid panel    TSH    T4, free    T4, free    TSH    Lipid panel    Hepatic function panel    CBC with Differential/Platelet    Basic metabolic panel  2. Medication management  Z79.899 Basic metabolic panel    CBC with Differential/Platelet    Hepatic function panel    Lipid panel    TSH    T4, free    T4, free    TSH    Lipid panel    Hepatic function panel    CBC with Differential/Platelet    Basic metabolic panel  3. Hyperlipidemia, unspecified hyperlipidemia type  E78.5 Basic metabolic panel    CBC with Differential/Platelet    Hepatic function panel    Lipid panel    TSH    T4, free    T4, free    TSH    Lipid panel    Hepatic function panel  CBC with Differential/Platelet    Basic metabolic panel  4. Tired  R53.83 Basic metabolic panel    CBC with Differential/Platelet    Hepatic function panel    Lipid panel    TSH    T4, free    T4, free    TSH    Lipid panel    Hepatic function panel    CBC with Differential/Platelet    Basic metabolic panel  Overdue for monitoring labs on her Crestor. May need intensification depending on levels. It sounds like her tired may be related to multiple factors including post menopause interrupted sleep and some stress. No obvious sleep apnea EOE being treated Reviewed interventions but if persistent and progressive we will reevaluate.  Lab monitoring today include thyroid She takes multivitamins is on  chronic PPI. Discussed Shingrix vaccine she can get anytime. Return for depending on results and how you are doing  or 1 year cpx .  Patient Care Team: Landy Mace, Neta Mends, MD as PCP - General Janalyn Harder, MD (Dermatology) Charna Elizabeth, MD as Consulting Physician (Gastroenterology) Genia Del, MD as Consulting Physician (Obstetrics and Gynecology) Patient Instructions   Optimize lifestyle some exercise outside time Up to my sleeve as possible Your exam is normal today and reassuring. We will check your cholesterol and metabolic factors. If difficulties are continuing and progressing come back for reevaluation discussion.   Health Maintenance, Female Adopting a healthy lifestyle and getting preventive care are important in promoting health and wellness. Ask your health care provider about:  The right schedule for you to have regular tests and exams.  Things you can do on your own to prevent diseases and keep yourself healthy. What should I know about diet, weight, and exercise? Eat a healthy diet  Eat a diet that includes plenty of vegetables, fruits, low-fat dairy products, and lean protein.  Do not eat a lot of foods that are high in solid fats, added sugars, or sodium.   Maintain a healthy weight Body mass index (BMI) is used to identify weight problems. It estimates body fat based on height and weight. Your health care provider can help determine your BMI and help you achieve or maintain a healthy weight. Get regular exercise Get regular exercise. This is one of the most important things you can do for your health. Most adults should:  Exercise for at least 150 minutes each week. The exercise should increase your heart rate and make you sweat (moderate-intensity exercise).  Do strengthening exercises at least twice a week. This is in addition to the moderate-intensity exercise.  Spend less time sitting. Even light physical activity can be beneficial. Watch  cholesterol and blood lipids Have your blood tested for lipids and cholesterol at 57 years of age, then have this test every 5 years. Have your cholesterol levels checked more often if:  Your lipid or cholesterol levels are high.  You are older than 57 years of age.  You are at high risk for heart disease. What should I know about cancer screening? Depending on your health history and family history, you may need to have cancer screening at various ages. This may include screening for:  Breast cancer.  Cervical cancer.  Colorectal cancer.  Skin cancer.  Lung cancer. What should I know about heart disease, diabetes, and high blood pressure? Blood pressure and heart disease  High blood pressure causes heart disease and increases the risk of stroke. This is more likely to develop in people who have high blood  pressure readings, are of African descent, or are overweight.  Have your blood pressure checked: ? Every 3-5 years if you are 66-80 years of age. ? Every year if you are 63 years old or older. Diabetes Have regular diabetes screenings. This checks your fasting blood sugar level. Have the screening done:  Once every three years after age 50 if you are at a normal weight and have a low risk for diabetes.  More often and at a younger age if you are overweight or have a high risk for diabetes. What should I know about preventing infection? Hepatitis B If you have a higher risk for hepatitis B, you should be screened for this virus. Talk with your health care provider to find out if you are at risk for hepatitis B infection. Hepatitis C Testing is recommended for:  Everyone born from [birth year] through 1967.  Anyone with known risk factors for hepatitis C. Sexually transmitted infections (STIs)  Get screened for STIs, including gonorrhea and chlamydia, if: ? You are sexually active and are younger than 57 years of age. ? You are older than 57 years of age and your health care  provider tells you that you are at risk for this type of infection. ? Your sexual activity has changed since you were last screened, and you are at increased risk for chlamydia or gonorrhea. Ask your health care provider if you are at risk.  Ask your health care provider about whether you are at high risk for HIV. Your health care provider may recommend a prescription medicine to help prevent HIV infection. If you choose to take medicine to prevent HIV, you should first get tested for HIV. You should then be tested every 3 months for as long as you are taking the medicine. Pregnancy  If you are about to stop having your period (premenopausal) and you may become pregnant, seek counseling before you get pregnant.  Take 400 to 800 micrograms (mcg) of folic acid every day if you become pregnant.  Ask for birth control (contraception) if you want to prevent pregnancy. Osteoporosis and menopause Osteoporosis is a disease in which the bones lose minerals and strength with aging. This can result in bone fractures. If you are 53 years old or older, or if you are at risk for osteoporosis and fractures, ask your health care provider if you should:  Be screened for bone loss.  Take a calcium or vitamin D supplement to lower your risk of fractures.  Be given hormone replacement therapy (HRT) to treat symptoms of menopause. Follow these instructions at home: Lifestyle  Do not use any products that contain nicotine or tobacco, such as cigarettes, e-cigarettes, and chewing tobacco. If you need help quitting, ask your health care provider.  Do not use street drugs.  Do not share needles.  Ask your health care provider for help if you need support or information about quitting drugs. Alcohol use  Do not drink alcohol if: ? Your health care provider tells you not to drink. ? You are pregnant, may be pregnant, or are planning to become pregnant.  If you drink alcohol: ? Limit how much you use to 0-1  drink a day. ? Limit intake if you are breastfeeding.  Be aware of how much alcohol is in your drink. In the [place] one drink equals one 12 oz bottle of beer (355 mL), one 5 oz glass of wine (148 mL), or one 1 oz glass of hard liquor (44 mL). General instructions  Schedule regular health, dental, and eye exams.  Stay current with your vaccines.  Tell your health care provider if: ? You often feel depressed. ? You have ever been abused or do not feel safe at home. Summary  Adopting a healthy lifestyle and getting preventive care are important in promoting health and wellness.  Follow your health care provider's instructions about healthy diet, exercising, and getting tested or screened for diseases.  Follow your health care provider's instructions on monitoring your cholesterol and blood pressure. This information is not intended to replace advice given to you by your health care provider. Make sure you discuss any questions you have with your health care provider. Document Revised: 10/10/2018 Document Reviewed: 10/10/2018 Elsevier Patient Education  2021 [employer].    [place]. Haillee Johann M.D.

## 2021-03-04 NOTE — Progress Notes (Signed)
Lipids much better!    stay on  statin medication  blood sugar is borderline  . But no diabetes or concern. Recheck at next yearly

## 2021-03-08 NOTE — Telephone Encounter (Signed)
Forms have been placed in red folder.  

## 2021-03-10 NOTE — Telephone Encounter (Signed)
Patient informed that her forms have been completed and placed at the front desk for her to sign so they can be faxed.

## 2021-03-10 NOTE — Telephone Encounter (Signed)
Form filled and completed   Back to your desk

## 2021-03-12 NOTE — Telephone Encounter (Signed)
Patients form has been faxed to [facility] diagnostics fax- [phone]

## 2021-03-25 ENCOUNTER — Other Ambulatory Visit: Payer: Self-pay | Admitting: Internal Medicine

## 2021-07-13 LAB — HM MAMMOGRAPHY

## 2021-07-14 ENCOUNTER — Encounter: Payer: Self-pay | Admitting: Obstetrics & Gynecology

## 2021-07-15 ENCOUNTER — Encounter: Payer: Self-pay | Admitting: Internal Medicine

## 2021-09-17 ENCOUNTER — Other Ambulatory Visit: Payer: Self-pay | Admitting: Internal Medicine

## 2021-12-15 ENCOUNTER — Ambulatory Visit (INDEPENDENT_AMBULATORY_CARE_PROVIDER_SITE_OTHER): Payer: 59 | Admitting: Obstetrics & Gynecology

## 2021-12-15 ENCOUNTER — Encounter: Payer: Self-pay | Admitting: Obstetrics & Gynecology

## 2021-12-15 ENCOUNTER — Other Ambulatory Visit (HOSPITAL_COMMUNITY)
Admission: RE | Admit: 2021-12-15 | Discharge: 2021-12-15 | Disposition: A | Discharge status: Home / Self Care | Payer: 59 | Source: Ambulatory Visit | Attending: Obstetrics & Gynecology | Admitting: Obstetrics & Gynecology

## 2021-12-15 ENCOUNTER — Other Ambulatory Visit: Payer: Self-pay

## 2021-12-15 VITALS — BP 112/70 | HR 71 | Ht 63.25 in | Wt 143.0 lb

## 2021-12-15 DIAGNOSIS — Z9889 Other specified postprocedural states: Secondary | ICD-10-CM

## 2021-12-15 DIAGNOSIS — Z780 Asymptomatic menopausal state: Secondary | ICD-10-CM | POA: Diagnosis not present

## 2021-12-15 DIAGNOSIS — Z01419 Encounter for gynecological examination (general) (routine) without abnormal findings: Secondary | ICD-10-CM | POA: Insufficient documentation

## 2021-12-15 DIAGNOSIS — Z789 Other specified health status: Secondary | ICD-10-CM

## 2021-12-15 NOTE — Progress Notes (Signed)
KC SEDLAK April 08, 1966 [id number]   History:    56 y.o. G4P2A2L2 Married.  Vasectomy   RP:  Established patient presenting for annual gyn exam    HPI: Postmenopause, well on no HRT.  No PMB. Mild occasional night sweats.  No pelvic pain.  No pain with IC, but some dryness, using KY.  H/O LEEP in 2005, margins negative.  Pap Neg in 12/2020.  Urine normal. Bowel movements normal.  Breasts normal.  Mammo Neg 07/2021.  Body mass index 25.13. Starting pickle ball.  Health labs with family physician.  Hypercholesterolemia on a Statin. Colono 12/2019.  Past medical history,surgical history, family history and social history were all reviewed and documented in the EPIC chart.  Gynecologic History Patient's last menstrual period was 05/06/2019.  Obstetric History OB History  Gravida Para Term Preterm AB Living  4 2 2   2 2   SAB IAB Ectopic Multiple Live Births  2       2    # Outcome Date GA Lbr Len/2nd Weight Sex Delivery Anes PTL Lv  4 SAB           3 SAB           2 Term     M Vag-Spont  N LIV  1 Term     F Vag-Spont  N LIV     ROS: A ROS was performed and pertinent positives and negatives are included in the history.  GENERAL: No fevers or chills. HEENT: No change in vision, no earache, sore throat or sinus congestion. NECK: No pain or stiffness. CARDIOVASCULAR: No chest pain or pressure. No palpitations. PULMONARY: No shortness of breath, cough or wheeze. GASTROINTESTINAL: No abdominal pain, nausea, vomiting or diarrhea, melena or bright red blood per rectum. GENITOURINARY: No urinary frequency, urgency, hesitancy or dysuria. MUSCULOSKELETAL: No joint or muscle pain, no back pain, no recent trauma. DERMATOLOGIC: No rash, no itching, no lesions. ENDOCRINE: No polyuria, polydipsia, no heat or cold intolerance. No recent change in weight. HEMATOLOGICAL: No anemia or easy bruising or bleeding. NEUROLOGIC: No headache, seizures, numbness, tingling or weakness. PSYCHIATRIC: No depression, no  loss of interest in normal activity or change in sleep pattern.     Exam:   BP 112/70    Pulse 71    Ht 5' 3.25" (1.607 m)    Wt 143 lb (64.9 kg)    LMP 05/06/2019    BMI 25.13 kg/m   Body mass index is 25.13 kg/m.  General appearance : Well developed well nourished female. No acute distress HEENT: Eyes: no retinal hemorrhage or exudates,  Neck supple, trachea midline, no carotid bruits, no thyroidmegaly Lungs: Clear to auscultation, no rhonchi or wheezes, or rib retractions  Heart: Regular rate and rhythm, no murmurs or gallops Breast:Examined in sitting and supine position were symmetrical in appearance, no palpable masses or tenderness,  no skin retraction, no nipple inversion, no nipple discharge, no skin discoloration, no axillary or supraclavicular lymphadenopathy Abdomen: no palpable masses or tenderness, no rebound or guarding Extremities: no edema or skin discoloration or tenderness  Pelvic: Vulva: Normal             Vagina: No gross lesions or discharge  Cervix: No gross lesions or discharge  Uterus  AV, normal size, shape and consistency, non-tender and mobile  Adnexa  Without masses or tenderness  Anus: Normal   Assessment/Plan:  56 y.o. female for annual exam   1. Encounter for routine gynecological examination with Papanicolaou  smear of cervix Postmenopause, well on no HRT.  No PMB. Mild occasional night sweats.  No pelvic pain.  No pain with IC, but some dryness, using KY.  H/O LEEP in 2005, margins negative.  Pap Neg in 12/2020.  Urine normal. Bowel movements normal.  Breasts normal.  Mammo Neg 07/2021.  Body mass index 25.13. Starting pickle ball.  Health labs with family physician.  Hypercholesterolemia on a Statin. Colono 12/2019. - Cytology - PAP( [place])  2. H/O LEEP - Cytology - PAP( [place])  3. Relies on vasectomy as primary birth control method  4. Postmenopause Postmenopause, well on no HRT.  No PMB. Mild occasional night sweats.  No pelvic  pain.  No pain with IC, but some dryness, using KY.  Recommend coconut oil.  Other orders - CALCIUM PO; Take by mouth. - Multiple Vitamin (MULTIVITAMIN PO); Take by mouth.   Genia Del MD, 4:00 PM 12/15/2021

## 2021-12-16 LAB — CYTOLOGY - PAP: Diagnosis: NEGATIVE

## 2022-03-14 NOTE — Progress Notes (Deleted)
No chief complaint on file.   HPI: Patient  Danielle Graham  56 y.o. comes in today for Preventive Health Care visit   Health Maintenance  Topic Date Due   Zoster Vaccines- Shingrix (1 of 2) Never done   COVID-19 Vaccine (3 - Moderna risk series) 03/09/2021   INFLUENZA VACCINE  05/31/2022   MAMMOGRAM  07/14/2022   PAP SMEAR-Modifier  12/15/2022   TETANUS/TDAP  05/23/2028   COLONOSCOPY (Pts 45-72yrs Insurance coverage will need to be confirmed)  01/23/2030   Hepatitis C Screening  Completed   HIV Screening  Completed   HPV VACCINES  Aged Out   Health Maintenance Review LIFESTYLE:  Exercise:   Tobacco/ETS: Alcohol:  Sugar beverages: Sleep: Drug use: no HH of  Work:    ROS:  GEN/ HEENT: No fever, significant weight changes sweats headaches vision problems hearing changes, CV/ PULM; No chest pain shortness of breath cough, syncope,edema  change in exercise tolerance. GI /GU: No adominal pain, vomiting, change in bowel habits. No blood in the stool. No significant GU symptoms. SKIN/HEME: ,no acute skin rashes suspicious lesions or bleeding. No lymphadenopathy, nodules, masses.  NEURO/ PSYCH:  No neurologic signs such as weakness numbness. No depression anxiety. IMM/ Allergy: No unusual infections.  Allergy .   REST of 12 system review negative except as per HPI   Past Medical History:  Diagnosis Date   Allergic rhinitis, seasonal    HERPES GENITALIS 11/06/2009   Qualifier: Diagnosis of  By: Lawernce Ion, CMA (AAMA), Bethann Berkshire    History of chicken pox    History of colonic polyps    pre cancer   HSV (herpes simplex virus) infection    HYPERGLYCEMIA, FASTING 03/22/2010   Qualifier: Diagnosis of  By: Fabian Sharp MD, Neta Mends    Hyperlipidemia    NSVD (normal spontaneous vaginal delivery)    x2   Overactive bladder    Urinary incontinence     Past Surgical History:  Procedure Laterality Date   BREAST BIOPSY  11/1999   right lumpectomy..fibroadenoma   COLONOSCOPY  March  26th, 2021   DILATION AND CURETTAGE OF UTERUS     miscarriage 1999..x2   Eosinophilic Esophagitis Biopsy  01/24/2020   [facility]   MYOMECTOMY     REFRACTIVE SURGERY Left    TONSILLECTOMY  1971   UPPER GI ENDOSCOPY  March 26th, 2021    Family History  Problem Relation Age of Onset   Asthma Mother    Colon cancer Father        deceased   Cancer Father 56       colon   Heart disease Paternal Grandmother        24 s   Other Other        ELEVated WBC count in half sister   Breast cancer Maternal Aunt     Social History   Socioeconomic History   Marital status: Married    Spouse name: Not on file   Number of children: Not on file   Years of education: Not on file   Highest education level: Not on file  Occupational History   Not on file  Tobacco Use   Smoking status: Former    Types: Cigarettes    Quit date: 09/27/2008    Years since quitting: 13.4   Smokeless tobacco: Never  Vaping Use   Vaping Use: Never used  Substance and Sexual Activity   Alcohol use: Not Currently    Comment: 1 glass  of wine daily   Drug use: No   Sexual activity: Yes    Partners: Male    Birth control/protection: Post-menopausal, Other-see comments    Comment: 1st intercourse- 15, partners- 2, married- 25 yrs, husband vasectomy  Other Topics Concern   Not on file  Social History Narrative   Married   HH of 3  1 dog    2 children and husband   1 dog   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        Has smoke detector and wears seat belts.  No firearms stored safely. . No excess sun exposure. Sees dentist regularly . No depression   G4P2            Social Determinants of Health   Financial Resource Strain: Not on file  Food Insecurity: Not on file  Transportation Needs: Not on file  Physical Activity: Not on file  Stress: Not on file  Social Connections: Not on file    Outpatient  Medications Prior to Visit  Medication Sig Dispense Refill   aspirin 81 MG tablet Take 81 mg by mouth daily.     CALCIUM PO Take by mouth.     FLOVENT HFA 110 MCG/ACT inhaler Inhale 2 puffs into the lungs 2 (two) times daily.     Multiple Vitamin (MULTIVITAMIN PO) Take by mouth.     rosuvastatin (CRESTOR) 10 MG tablet TAKE 1 TABLET BY MOUTH EVERY DAY 30 tablet 5   No facility-administered medications prior to visit.     EXAM:  LMP 05/06/2019   There is no height or weight on file to calculate BMI. Wt Readings from Last 3 Encounters:  12/15/21 143 lb (64.9 kg)  03/03/21 139 lb 9.6 oz (63.3 kg)  12/11/20 139 lb (63 kg)    Physical Exam: Vital signs reviewed [id number] is a well-developed well-nourished alert cooperative    who appearsr stated age in no acute distress.  HEENT: normocephalic atraumatic , Eyes: PERRL EOM's full, conjunctiva clear, Nares: paten,t no deformity discharge or tenderness., Ears: no deformity EAC's clear TMs with normal landmarks. NECK: supple without masses, thyromegaly or bruits. CHEST/PULM:  Clear to auscultation and percussion breath sounds equal no wheeze , rales or rhonchi. No chest wall deformities or tenderness. Breast: normal by inspection . No dimpling, discharge, masses, tenderness or discharge . CV: PMI is nondisplaced, S1 S2 no gallops, murmurs, rubs. Peripheral pulses are full without delay.No JVD .  ABDOMEN: Bowel sounds normal nontender  No guard or rebound, no hepato splenomegal no CVA tenderness.   Extremtities:  No clubbing cyanosis or edema, no acute joint swelling or redness no focal atrophy NEURO:  Oriented x3, cranial nerves 3-12 appear to be intact, no obvious focal weakness,gait within normal limits no abnormal reflexes or asymmetrical SKIN: No acute rashes normal turgor, color, no bruising or petechiae. PSYCH: Oriented, good eye contact, no obvious depression anxiety, cognition and judgment appear normal. LN: no cervical axillary  inguinal adenopathy  Lab Results  Component Value Date   WBC 5.1 03/03/2021   HGB 13.1 03/03/2021   HCT 39.1 03/03/2021   PLT 304.0 03/03/2021   GLUCOSE 100 (H) 03/03/2021   CHOL 210 (H) 03/03/2021   TRIG 118.0 03/03/2021   HDL 76.80 03/03/2021   LDLDIRECT 148.9 11/13/2012   LDLCALC 110 (H) 03/03/2021   ALT 19 03/03/2021   AST 17 03/03/2021  NA 141 03/03/2021   K 4.5 03/03/2021   CL 104 03/03/2021   CREATININE 0.77 03/03/2021   BUN 16 03/03/2021   CO2 30 03/03/2021   TSH 1.32 03/03/2021   HGBA1C 5.8 11/27/2019    BP Readings from Last 3 Encounters:  12/15/21 112/70  03/03/21 116/66  12/11/20 128/80    Lab results plan with patient   ASSESSMENT AND PLAN:  Discussed the following assessment and plan:    ICD-10-CM   1. Visit for preventive health examination  Z00.00     2. Medication management  Z79.899      No follow-ups on file.  Patient Care Team: Monti Jilek, Neta MendsWanda K, MD as PCP - General Janalyn Harderafeen, Stuart, MD (Dermatology) Charna ElizabethMann, Jyothi, MD as Consulting Physician (Gastroenterology) Genia DelLavoie, Marie-Lyne, MD as Consulting Physician (Obstetrics and Gynecology) There are no Patient Instructions on file for this visit.  Neta MendsWanda K. Damarea Merkel M.D.

## 2022-03-15 ENCOUNTER — Encounter: Payer: 59 | Admitting: Internal Medicine

## 2022-03-15 DIAGNOSIS — Z0000 Encounter for general adult medical examination without abnormal findings: Secondary | ICD-10-CM

## 2022-03-15 DIAGNOSIS — Z79899 Other long term (current) drug therapy: Secondary | ICD-10-CM

## 2022-03-16 ENCOUNTER — Other Ambulatory Visit: Payer: Self-pay | Admitting: Internal Medicine

## 2022-03-29 NOTE — Progress Notes (Unsigned)
No chief complaint on file.   HPI: Patient  Danielle Graham  56 y.o. comes in today for Preventive Health Care visit   Health Maintenance  Topic Date Due   Zoster Vaccines- Shingrix (1 of 2) Never done   COVID-19 Vaccine (3 - Moderna risk series) 03/09/2021   INFLUENZA VACCINE  05/31/2022   MAMMOGRAM  07/14/2022   PAP SMEAR-Modifier  12/15/2022   TETANUS/TDAP  05/23/2028   COLONOSCOPY (Pts 45-49yrs Insurance coverage will need to be confirmed)  01/23/2030   Hepatitis C Screening  Completed   HIV Screening  Completed   HPV VACCINES  Aged Out   Health Maintenance Review LIFESTYLE:  Exercise:   Tobacco/ETS: Alcohol:  Sugar beverages: Sleep: Drug use: no HH of  Work:    ROS:  GEN/ HEENT: No fever, significant weight changes sweats headaches vision problems hearing changes, CV/ PULM; No chest pain shortness of breath cough, syncope,edema  change in exercise tolerance. GI /GU: No adominal pain, vomiting, change in bowel habits. No blood in the stool. No significant GU symptoms. SKIN/HEME: ,no acute skin rashes suspicious lesions or bleeding. No lymphadenopathy, nodules, masses.  NEURO/ PSYCH:  No neurologic signs such as weakness numbness. No depression anxiety. IMM/ Allergy: No unusual infections.  Allergy .   REST of 12 system review negative except as per HPI   Past Medical History:  Diagnosis Date   Allergic rhinitis, seasonal    HERPES GENITALIS 11/06/2009   Qualifier: Diagnosis of  By: Lawernce Ion, CMA (AAMA), Bethann Berkshire    History of chicken pox    History of colonic polyps    pre cancer   HSV (herpes simplex virus) infection    HYPERGLYCEMIA, FASTING 03/22/2010   Qualifier: Diagnosis of  By: Fabian Sharp MD, Neta Mends    Hyperlipidemia    NSVD (normal spontaneous vaginal delivery)    x2   Overactive bladder    Urinary incontinence     Past Surgical History:  Procedure Laterality Date   BREAST BIOPSY  11/1999   right lumpectomy..fibroadenoma   COLONOSCOPY  March  26th, 2021   DILATION AND CURETTAGE OF UTERUS     miscarriage 1999..x2   Eosinophilic Esophagitis Biopsy  01/24/2020   [facility]   MYOMECTOMY     REFRACTIVE SURGERY Left    TONSILLECTOMY  1971   UPPER GI ENDOSCOPY  March 26th, 2021    Family History  Problem Relation Age of Onset   Asthma Mother    Colon cancer Father        deceased   Cancer Father 45       colon   Heart disease Paternal Grandmother        85 s   Other Other        ELEVated WBC count in half sister   Breast cancer Maternal Aunt     Social History   Socioeconomic History   Marital status: Married    Spouse name: Not on file   Number of children: Not on file   Years of education: Not on file   Highest education level: Not on file  Occupational History   Not on file  Tobacco Use   Smoking status: Former    Types: Cigarettes    Quit date: 09/27/2008    Years since quitting: 13.5   Smokeless tobacco: Never  Vaping Use   Vaping Use: Never used  Substance and Sexual Activity   Alcohol use: Not Currently    Comment: 1 glass  of wine daily   Drug use: No   Sexual activity: Yes    Partners: Male    Birth control/protection: Post-menopausal, Other-see comments    Comment: 1st intercourse- 15, partners- 2, married- 25 yrs, husband vasectomy  Other Topics Concern   Not on file  Social History Narrative   Married   HH of 3  1 dog    2 children and husband   1 dog   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        Has smoke detector and wears seat belts.  No firearms stored safely. . No excess sun exposure. Sees dentist regularly . No depression   G4P2            Social Determinants of Health   Financial Resource Strain: Not on file  Food Insecurity: Not on file  Transportation Needs: Not on file  Physical Activity: Not on file  Stress: Not on file  Social Connections: Not on file    Outpatient  Medications Prior to Visit  Medication Sig Dispense Refill   aspirin 81 MG tablet Take 81 mg by mouth daily.     CALCIUM PO Take by mouth.     FLOVENT HFA 110 MCG/ACT inhaler Inhale 2 puffs into the lungs 2 (two) times daily.     Multiple Vitamin (MULTIVITAMIN PO) Take by mouth.     rosuvastatin (CRESTOR) 10 MG tablet TAKE 1 TABLET BY MOUTH EVERY DAY 30 tablet 5   No facility-administered medications prior to visit.     EXAM:  LMP 05/06/2019   There is no height or weight on file to calculate BMI. Wt Readings from Last 3 Encounters:  12/15/21 143 lb (64.9 kg)  03/03/21 139 lb 9.6 oz (63.3 kg)  12/11/20 139 lb (63 kg)    Physical Exam: Vital signs reviewed [id number]:This is a well-developed well-nourished alert cooperative    who appearsr stated age in no acute distress.  HEENT: normocephalic atraumatic , Eyes: PERRL EOM's full, conjunctiva clear, Nares: paten,t no deformity discharge or tenderness., Ears: no deformity EAC's clear TMs with normal landmarks. Mouth: clear OP, no lesions, edema.  Moist mucous membranes. Dentition in adequate repair. NECK: supple without masses, thyromegaly or bruits. CHEST/PULM:  Clear to auscultation and percussion breath sounds equal no wheeze , rales or rhonchi. No chest wall deformities or tenderness. Breast: normal by inspection . No dimpling, discharge, masses, tenderness or discharge . CV: PMI is nondisplaced, S1 S2 no gallops, murmurs, rubs. Peripheral pulses are full without delay.No JVD .  ABDOMEN: Bowel sounds normal nontender  No guard or rebound, no hepato splenomegal no CVA tenderness.  No hernia. Extremtities:  No clubbing cyanosis or edema, no acute joint swelling or redness no focal atrophy NEURO:  Oriented x3, cranial nerves 3-12 appear to be intact, no obvious focal weakness,gait within normal limits no abnormal reflexes or asymmetrical SKIN: No acute rashes normal turgor, color, no bruising or petechiae. PSYCH: Oriented, good eye contact,  no obvious depression anxiety, cognition and judgment appear normal. LN: no cervical axillary inguinal adenopathy  Lab Results  Component Value Date   WBC 5.1 03/03/2021   HGB 13.1 03/03/2021   HCT 39.1 03/03/2021   PLT 304.0 03/03/2021   GLUCOSE 100 (H) 03/03/2021   CHOL 210 (H) 03/03/2021   TRIG 118.0 03/03/2021   HDL 76.80 03/03/2021   LDLDIRECT 148.9 11/13/2012  LDLCALC 110 (H) 03/03/2021   ALT 19 03/03/2021   AST 17 03/03/2021   NA 141 03/03/2021   K 4.5 03/03/2021   CL 104 03/03/2021   CREATININE 0.77 03/03/2021   BUN 16 03/03/2021   CO2 30 03/03/2021   TSH 1.32 03/03/2021   HGBA1C 5.8 11/27/2019    BP Readings from Last 3 Encounters:  12/15/21 112/70  03/03/21 116/66  12/11/20 128/80    Lab results reviewed with patient   ASSESSMENT AND PLAN:  Discussed the following assessment and plan:  No diagnosis found. No follow-ups on file.  Patient Care Team: Karry Barrilleaux, Neta Mends, MD as PCP - General Janalyn Harder, MD (Dermatology) Charna Elizabeth, MD as Consulting Physician (Gastroenterology) Genia Del, MD as Consulting Physician (Obstetrics and Gynecology) There are no Patient Instructions on file for this visit.  Neta Mends. [place] Decoteau M.D.

## 2022-03-30 ENCOUNTER — Encounter: Payer: Self-pay | Admitting: Internal Medicine

## 2022-03-30 ENCOUNTER — Ambulatory Visit (INDEPENDENT_AMBULATORY_CARE_PROVIDER_SITE_OTHER): Payer: 59 | Admitting: Internal Medicine

## 2022-03-30 VITALS — BP 112/74 | HR 65 | Temp 98.4°F | Ht 63.25 in | Wt 137.8 lb

## 2022-03-30 DIAGNOSIS — Z79899 Other long term (current) drug therapy: Secondary | ICD-10-CM

## 2022-03-30 DIAGNOSIS — R002 Palpitations: Secondary | ICD-10-CM

## 2022-03-30 DIAGNOSIS — E785 Hyperlipidemia, unspecified: Secondary | ICD-10-CM | POA: Diagnosis not present

## 2022-03-30 DIAGNOSIS — Z0000 Encounter for general adult medical examination without abnormal findings: Secondary | ICD-10-CM | POA: Diagnosis not present

## 2022-03-30 DIAGNOSIS — R41840 Attention and concentration deficit: Secondary | ICD-10-CM | POA: Diagnosis not present

## 2022-03-30 DIAGNOSIS — K200 Eosinophilic esophagitis: Secondary | ICD-10-CM

## 2022-03-30 LAB — LIPID PANEL
Cholesterol: 213 mg/dL — ABNORMAL HIGH (ref 0–200)
HDL: 85.1 mg/dL (ref >39.00)
LDL Cholesterol: 113 mg/dL — ABNORMAL HIGH (ref 0–99)
NonHDL: 127.58
Total CHOL/HDL Ratio: 2
Triglycerides: 75 mg/dL (ref 0.0–149.0)
VLDL: 15 mg/dL (ref 0.0–40.0)

## 2022-03-30 LAB — HEPATIC FUNCTION PANEL
ALT: 15 U/L (ref 0–35)
AST: 19 U/L (ref 0–37)
Albumin: 4.4 g/dL (ref 3.5–5.2)
Alkaline Phosphatase: 64 U/L (ref 39–117)
Bilirubin, Direct: 0.2 mg/dL (ref 0.0–0.3)
Total Bilirubin: 0.9 mg/dL (ref 0.2–1.2)
Total Protein: 6.9 g/dL (ref 6.0–8.3)

## 2022-03-30 LAB — TSH: TSH: 1.47 u[IU]/mL (ref 0.35–5.50)

## 2022-03-30 LAB — CBC WITH DIFFERENTIAL/PLATELET
Basophils Absolute: 0 10*3/uL (ref 0.0–0.1)
Basophils Relative: 0.7 % (ref 0.0–3.0)
Eosinophils Absolute: 0.2 10*3/uL (ref 0.0–0.7)
Eosinophils Relative: 4.8 % (ref 0.0–5.0)
HCT: 40.3 % (ref 36.0–46.0)
Hemoglobin: 13.5 g/dL (ref 12.0–15.0)
Lymphocytes Relative: 36.1 % (ref 12.0–46.0)
Lymphs Abs: 1.7 10*3/uL (ref 0.7–4.0)
MCHC: 33.4 g/dL (ref 30.0–36.0)
MCV: 89.5 fl (ref 78.0–100.0)
Monocytes Absolute: 0.4 10*3/uL (ref 0.1–1.0)
Monocytes Relative: 8.7 % (ref 3.0–12.0)
Neutro Abs: 2.3 10*3/uL (ref 1.4–7.7)
Neutrophils Relative %: 49.7 % (ref 43.0–77.0)
Platelets: 297 10*3/uL (ref 150.0–400.0)
RBC: 4.51 Mil/uL (ref 3.87–5.11)
RDW: 13.9 % (ref 11.5–15.5)
WBC: 4.6 10*3/uL (ref 4.0–10.5)

## 2022-03-30 LAB — BASIC METABOLIC PANEL
BUN: 11 mg/dL (ref 6–23)
CO2: 29 mEq/L (ref 19–32)
Calcium: 9.6 mg/dL (ref 8.4–10.5)
Chloride: 103 mEq/L (ref 96–112)
Creatinine, Ser: 0.8 mg/dL (ref 0.40–1.20)
GFR: 81.28 mL/min (ref >60.00)
Glucose, Bld: 91 mg/dL (ref 70–99)
Potassium: 3.9 mEq/L (ref 3.5–5.1)
Sodium: 141 mEq/L (ref 135–145)

## 2022-03-30 LAB — T4, FREE: Free T4: 0.98 ng/dL (ref 0.60–1.60)

## 2022-03-30 LAB — C-REACTIVE PROTEIN: CRP: <1 mg/dL (ref 0.5–20.0)

## 2022-03-30 LAB — HEMOGLOBIN A1C: Hgb A1c MFr Bld: 6 % (ref 4.6–6.5)

## 2022-03-30 NOTE — Patient Instructions (Addendum)
Good to see you  today  Your exam is normal.  EKG is good   Lab  pending . Make sure getting enough sleep  . Focusing  techniques as discussed .  Freedom from distraction.   Monitor pulse rate ..  if getting   120 and above let us know.   Some of your sx seem like slowing of rapid switching from menopause and new job.  If hips and joint pains   problem   could see sports medicine .   Can try ocassional ibuprofen if ok with Dr Loreta Ave .

## 2022-03-31 ENCOUNTER — Encounter: Payer: Self-pay | Admitting: Internal Medicine

## 2022-03-31 NOTE — Progress Notes (Signed)
Lab results normal including thyroid  . Cholesterol favorable

## 2022-07-19 LAB — HM MAMMOGRAPHY

## 2022-07-20 ENCOUNTER — Encounter: Payer: Self-pay | Admitting: Obstetrics & Gynecology

## 2022-07-29 ENCOUNTER — Encounter: Payer: Self-pay | Admitting: Internal Medicine

## 2022-09-13 ENCOUNTER — Other Ambulatory Visit: Payer: Self-pay | Admitting: Internal Medicine

## 2022-09-13 DIAGNOSIS — E785 Hyperlipidemia, unspecified: Secondary | ICD-10-CM

## 2022-09-28 ENCOUNTER — Ambulatory Visit (INDEPENDENT_AMBULATORY_CARE_PROVIDER_SITE_OTHER): Payer: 59

## 2022-09-28 ENCOUNTER — Ambulatory Visit (INDEPENDENT_AMBULATORY_CARE_PROVIDER_SITE_OTHER): Payer: 59 | Admitting: Internal Medicine

## 2022-09-28 VITALS — BP 116/76 | HR 92 | Temp 98.7°F | Wt 139.2 lb

## 2022-09-28 DIAGNOSIS — J989 Respiratory disorder, unspecified: Secondary | ICD-10-CM

## 2022-09-28 DIAGNOSIS — R509 Fever, unspecified: Secondary | ICD-10-CM | POA: Diagnosis not present

## 2022-09-28 DIAGNOSIS — R6889 Other general symptoms and signs: Secondary | ICD-10-CM | POA: Diagnosis not present

## 2022-09-28 DIAGNOSIS — R051 Acute cough: Secondary | ICD-10-CM | POA: Diagnosis not present

## 2022-09-28 LAB — POCT INFLUENZA A/B
Influenza A, POC: NEGATIVE
Influenza B, POC: NEGATIVE

## 2022-09-28 LAB — POC COVID19 BINAXNOW: SARS Coronavirus 2 Ag: NEGATIVE

## 2022-09-28 MED ORDER — AZITHROMYCIN 250 MG PO TABS: ORAL_TABLET | ORAL | 0 refills | Status: AC

## 2022-09-28 NOTE — Patient Instructions (Signed)
Rest fluids  comfort care  Antibiotic may help if this is atypical  bacterial infection . ( If viral infection resolves  on its own)   Fever should be gone in the next 2 days  if not or getting worse  contact medical team for follow up.

## 2022-09-28 NOTE — Progress Notes (Signed)
Chief Complaint  Patient presents with   Cough    Pt c/o cough- productive cough with yellow phlegm. Sx started on Sunday. Feeling congested in head. Chills and low grade fever. Feels throat is sore. Tested for strept at urgent care this morning-result was negative. Tried benzonatate, night/day quil.    Fatigue    HPI: Danielle Graham 56 y.o. come in for  on going  resp sx  Onset Nov 26 actu onset fever chills and then significnat congestion head and then coughing a lot  thick mucous   seen in UC  2 days ago  neg flu covid screen and neg strep .  Sx rx given  since then   Very congested and cough is wore than  usually has  in past  if sick  . No cp ? Som sob . No hemoptysis   no  family ill  tmep last night about 100.1+  Coughing  up a lot   tired tyring to owrk from home  Travel    Back from Nov 17  last travel  [place] on Friday    then football game weekend  ROS: See pertinent positives and negatives per HPI.  Past Medical History:  Diagnosis Date   Allergic rhinitis, seasonal    HERPES GENITALIS 11/06/2009   Qualifier: Diagnosis of  By: Cranford, CMA (AAMA), Shannon S    History of chicken pox    History of colonic polyps    pre cancer   HSV (herpes simplex virus) infection    HYPERGLYCEMIA, FASTING 03/22/2010   Qualifier: Diagnosis of  By: Vyctoria Dickman MD, Chloee Tena K    Hyperlipidemia    NSVD (normal spontaneous vaginal delivery)    x2   Overactive bladder    Urinary incontinence     Family History  Problem Relation Age of Onset   Asthma Mother    Colon cancer Father        deceased   Cancer Father 83       colon   Heart disease Paternal Grandmother        60  s   Other Other        ELEVated WBC count in half sister   Breast cancer Maternal Aunt     Social History   Socioeconomic History   Marital status: Married    Spouse name: Not on file   Number of children: Not on file   Years of education: Not on file   Highest education level: Bachelor's degree (e.g.,  BA, AB, BS)  Occupational History   Not on file  Tobacco Use   Smoking status: Former    Types: Cigarettes    Quit date: 09/27/2008    Years since quitting: 14.0   Smokeless tobacco: Never  Vaping Use   Vaping Use: Never used  Substance and Sexual Activity   Alcohol use: Not Currently    Comment: 1 glass of wine daily   Drug use: No   Sexual activity: Yes    Partners: Male    Birth control/protection: Post-menopausal, Other-see comments    Comment: 1st intercourse- 15, partners- 2, married- 25 yrs, husband vasectomy  Other Topics Concern   Not on file  Social History Narrative   Married   HH of 3  1 dog    2 children and husband   1 dog   Occupation: 18 [employer] bs degree 7 hours  New job recently   No tobacc  2 caffien  Few  drinks weekend .        Has smoke detector and wears seat belts.  No firearms stored safely. . No excess sun exposure. Sees dentist regularly . No depression   G4P2            Social Determinants of Health   Financial Resource Strain: Low Risk  (09/28/2022)   Overall Financial Resource Strain (CARDIA)    Difficulty of Paying Living Expenses: Not hard at all  Food Insecurity: No Food Insecurity (09/28/2022)   Hunger Vital Sign    Worried About Running Out of Food in the Last Year: Never true    Ran Out of Food in the Last Year: Never true  Transportation Needs: No Transportation Needs (09/28/2022)   PRAPARE - Administrator, Civil Service (Medical): No    Lack of Transportation (Non-Medical): No  Physical Activity: Insufficiently Active (09/28/2022)   Exercise Vital Sign    Days of Exercise per Week: 1 day    Minutes of Exercise per Session: 50 min  Stress: No Stress Concern Present (09/28/2022)   [employer] of Occupational Health - Occupational Stress Questionnaire    Feeling of Stress : Only a little  Social Connections: Socially Integrated (09/28/2022)   Social Connection and Isolation Panel [NHANES]     Frequency of Communication with Friends and Family: Three times a week    Frequency of Social Gatherings with Friends and Family: Once a week    Attends Religious Services: More than 4 times per year    Active Member of Clubs or Organizations: Yes    Attends Banker Meetings: 1 to 4 times per year    Marital Status: Married    Outpatient Medications Prior to Visit  Medication Sig Dispense Refill   aspirin 81 MG tablet Take 81 mg by mouth daily.     calcium carbonate (OS-CAL) 1250 (500 Ca) MG chewable tablet Chew by mouth.     CALCIUM PO Take by mouth.     famotidine (PEPCID) 20 MG tablet 1-2 times per day     FLOVENT HFA 110 MCG/ACT inhaler Inhale 2 puffs into the lungs 2 (two) times daily.     Multiple Vitamin (MULTIVITAMIN PO) Take by mouth.     rosuvastatin (CRESTOR) 10 MG tablet TAKE 1 TABLET BY MOUTH EVERY DAY 30 tablet 5   omeprazole (PRILOSEC) 40 MG capsule Take 40 mg by mouth every morning.     No facility-administered medications prior to visit.     EXAM:  BP 116/76 (BP Location: Left Arm, Patient Position: Sitting, Cuff Size: Normal)   Pulse 92   Temp 98.7 F (37.1 C) (Oral)   Wt 139 lb 3.2 oz (63.1 kg)   LMP 05/06/2019   SpO2 94%   BMI 24.46 kg/m   Body mass index is 24.46 kg/m.  GENERAL: vitals reviewed and listed above, alert, oriented, appears well hydrated and in no acute distress coughing non toxic but sick  HEENT: atraumatic, conjunctiva  clear, no obvious abnormalities on inspection of external nose and earstm clear fluid left middle ear  OP : no lesion edema or exudate  very congested no face pain  NECK: no obvious masses on inspection palpation  LUNGS: ? Crackle at left base no rales  no wheezes, rales or rhonchi,  CV: HRRR, no clubbing cyanosis or  peripheral edema nl cap refill  MS: moves all extremities without noticeable focal  abnormality PSYCH: pleasant and cooperative, no obvious depression or anxiety Lab  Results  Component Value  Date   WBC 4.6 03/30/2022   HGB 13.5 03/30/2022   HCT 40.3 03/30/2022   PLT 297.0 03/30/2022   GLUCOSE 91 03/30/2022   CHOL 213 (H) 03/30/2022   TRIG 75.0 03/30/2022   HDL 85.10 03/30/2022   LDLDIRECT 148.9 11/13/2012   LDLCALC 113 (H) 03/30/2022   ALT 15 03/30/2022   AST 19 03/30/2022   NA 141 03/30/2022   K 3.9 03/30/2022   CL 103 03/30/2022   CREATININE 0.80 03/30/2022   BUN 11 03/30/2022   CO2 29 03/30/2022   TSH 1.47 03/30/2022   HGBA1C 6.0 03/30/2022   BP Readings from Last 3 Encounters:  09/28/22 116/76  03/30/22 112/74  12/15/21 112/70  Neg covid and flu  rapid  IMPRESSION: Perihilar interstitial thickening, which may reflect bronchitic type lung changes. No lobar consolidation.     Electronically Signed   By: Duanne Guess D.O.   On: 09/28/2022 15:51 ASSESSMENT AND PLAN:  Discussed the following assessment and plan:  Respiratory illness with fever  Flu-like symptoms  Acute cough - Plan: POC COVID-19 BinaxNow, POCT Influenza A/B, DG Chest 2 View  Fever, unspecified fever cause - Plan: POCT Influenza A/B, DG Chest 2 View C xray   cw viral  vs poss atypical   option to rx empirically    Rx azithromycin   cont supportive care   Note for work  -Patient advised to return or notify health care team  if  new concerns arise.  Patient Instructions  Rest fluids  comfort care  Antibiotic may help if this is atypical  bacterial infection . ( If viral infection resolves  on its own)   Fever should be gone in the next 2 days  if not or getting worse  contact medical team for follow up.    Neta Mends. Etan Vasudevan M.D.

## 2022-12-20 ENCOUNTER — Ambulatory Visit (INDEPENDENT_AMBULATORY_CARE_PROVIDER_SITE_OTHER): Payer: 59 | Admitting: Obstetrics & Gynecology

## 2022-12-20 ENCOUNTER — Other Ambulatory Visit (HOSPITAL_COMMUNITY)
Admission: RE | Admit: 2022-12-20 | Discharge: 2022-12-20 | Disposition: A | Discharge status: Home / Self Care | Payer: 59 | Source: Ambulatory Visit | Attending: Obstetrics & Gynecology | Admitting: Obstetrics & Gynecology

## 2022-12-20 ENCOUNTER — Encounter: Payer: Self-pay | Admitting: Obstetrics & Gynecology

## 2022-12-20 VITALS — BP 100/64 | HR 80 | Ht 63.25 in | Wt 139.0 lb

## 2022-12-20 DIAGNOSIS — Z9889 Other specified postprocedural states: Secondary | ICD-10-CM | POA: Insufficient documentation

## 2022-12-20 DIAGNOSIS — Z23 Encounter for immunization: Secondary | ICD-10-CM | POA: Diagnosis not present

## 2022-12-20 DIAGNOSIS — Z01419 Encounter for gynecological examination (general) (routine) without abnormal findings: Secondary | ICD-10-CM | POA: Diagnosis not present

## 2022-12-20 DIAGNOSIS — Z780 Asymptomatic menopausal state: Secondary | ICD-10-CM

## 2022-12-20 DIAGNOSIS — N9411 Superficial (introital) dyspareunia: Secondary | ICD-10-CM

## 2022-12-20 MED ORDER — CLOBETASOL PROPIONATE 0.05 % EX OINT: 1.0000 | TOPICAL_OINTMENT | Freq: Two times a day (BID) | CUTANEOUS | 0 refills | Status: DC

## 2022-12-20 NOTE — Progress Notes (Signed)
Danielle Graham 1966-02-23 [id number]   History:    57 y.o.  [id number] Married.  Vasectomy   RP:  Established patient presenting for annual gyn exam    HPI: Postmenopause, well on no HRT. No PMB. Mild occasional night sweats.  No pelvic pain. Pt c/o pain with intercourse at penetration anteriorly.  Occasional urinary incontinence at night and when sneezing. H/O LEEP in 2005, margins negative.  Pap Neg in 12/2021.  Pap reflex today. Bowel movements normal. Breasts normal.  Mammo Neg 07/2022.  Body mass index 24.43. Playing pickle ball.  Health labs with family physician. Hypercholesterolemia on a Statin. Colono 12/2019. Fam h/o Colon Ca.  Flu vaccine given here today.  Past medical history,surgical history, family history and social history were all reviewed and documented in the EPIC chart.  Gynecologic History Patient's last menstrual period was 05/06/2019.  Obstetric History OB History  Gravida Para Term Preterm AB Living  4 2 2   2 2  $ SAB IAB Ectopic Multiple Live Births  2       2    # Outcome Date GA Lbr Len/2nd Weight Sex Delivery Anes PTL Lv  4 SAB           3 SAB           2 Term     M Vag-Spont  N LIV  1 Term     F Vag-Spont  N LIV     ROS: A ROS was performed and pertinent positives and negatives are included in the history. GENERAL: No fevers or chills. HEENT: No change in vision, no earache, sore throat or sinus congestion. NECK: No pain or stiffness. CARDIOVASCULAR: No chest pain or pressure. No palpitations. PULMONARY: No shortness of breath, cough or wheeze. GASTROINTESTINAL: No abdominal pain, nausea, vomiting or diarrhea, melena or bright red blood per rectum. GENITOURINARY: No urinary frequency, urgency, hesitancy or dysuria. MUSCULOSKELETAL: No joint or muscle pain, no back pain, no recent trauma. DERMATOLOGIC: No rash, no itching, no lesions. ENDOCRINE: No polyuria, polydipsia, no heat or cold intolerance. No recent change in weight. HEMATOLOGICAL: No anemia or easy  bruising or bleeding. NEUROLOGIC: No headache, seizures, numbness, tingling or weakness. PSYCHIATRIC: No depression, no loss of interest in normal activity or change in sleep pattern.     Exam:   BP 100/64   Pulse 80   Ht 5' 3.25" (1.607 m)   Wt 139 lb (63 kg)   LMP 05/06/2019 Comment: sexually active  SpO2 98%   BMI 24.43 kg/m   Body mass index is 24.43 kg/m.  General appearance : Well developed well nourished female. No acute distress HEENT: Eyes: no retinal hemorrhage or exudates,  Neck supple, trachea midline, no carotid bruits, no thyroidmegaly Lungs: Clear to auscultation, no rhonchi or wheezes, or rib retractions  Heart: Regular rate and rhythm, no murmurs or gallops Breast:Examined in sitting and supine position were symmetrical in appearance, no palpable masses or tenderness,  no skin retraction, no nipple inversion, no nipple discharge, no skin discoloration, no axillary or supraclavicular lymphadenopathy Abdomen: no palpable masses or tenderness, no rebound or guarding Extremities: no edema or skin discoloration or tenderness  Pelvic: Vulva: Normal except white area at anterior introitus just above urethra.             Vagina: No gross lesions or discharge  Cervix: No gross lesions or discharge.  Pap reflex done.  Uterus  AV, normal size, shape and consistency, non-tender and mobile  Adnexa  Without masses or tenderness  Anus: Normal   Assessment/Plan:  57 y.o. female for annual exam   1. Encounter for routine gynecological examination with Papanicolaou smear of cervix Postmenopause, well on no HRT. No PMB. Mild occasional night sweats.  No pelvic pain. Pt c/o pain with intercourse at penetration anteriorly.  Occasional urinary incontinence at night and when sneezing. H/O LEEP in 2005, margins negative.  Pap Neg in 12/2021.  Pap reflex today. Bowel movements normal. Breasts normal.  Mammo Neg 07/2022.  Body mass index 24.43. Playing pickle ball.  Health labs with family  physician. Hypercholesterolemia on a Statin. Colono 12/2019. Fam h/o Colon Ca.  Flu vaccine given here today. - Cytology - PAP( [place])  2. H/O LEEP - Cytology - PAP( [place])  3. Postmenopause Postmenopause, well on no HRT. No PMB. Mild occasional night sweats.  No pelvic pain.  4. Superficial (introital) dyspareunia Pt c/o pain with intercourse at penetration anteriorly. On Gyn exam: Vulva normal except white area at anterior introitus just above urethra.  Probable chronic inflammation.  Will treat with Clobetasol ointment 0.05%.  Usage reviewed.  Prescription sent to pharmacy.  Will use coconut oil for IC.  F/U reevaluation and Bx if no improvement.  5. Need for immunization against influenza - Flu Vaccine QUAD 77moIM (Fluarix, Fluzone & Alfiuria Quad PF)  Other orders - ARNUITY ELLIPTA 100 MCG/ACT AEPB;  (Patient not taking: Reported on 12/20/2022) - Omeprazole 20 MG TBDD - clobetasol ointment (TEMOVATE) 0.05 %; Apply 1 Application topically 2 (two) times daily. Thin application on affected vulva twice a day for 1 week, then HS for 1 week, then every other day x 2 weeks.   MPrincess BruinsMD, 4:06 PM

## 2022-12-21 ENCOUNTER — Telehealth: Payer: Self-pay

## 2022-12-21 LAB — CYTOLOGY - PAP: Diagnosis: NEGATIVE

## 2022-12-21 NOTE — Telephone Encounter (Signed)
Patient said she had visit with Dr. Marguerita Merles yesterday and forgot to ask her recommendation for internist to be her general practice provider.  Wanted me to ask.

## 2022-12-26 NOTE — Telephone Encounter (Signed)
Spoke with patient and informed her. °

## 2023-02-03 ENCOUNTER — Telehealth: Payer: Self-pay | Admitting: Internal Medicine

## 2023-02-03 NOTE — Telephone Encounter (Signed)
Pt leaves for cruise may 31, requesting motion sickness patches

## 2023-02-06 MED ORDER — SCOPOLAMINE 1 MG/3DAYS TD PT72: 1.0000 | MEDICATED_PATCH | TRANSDERMAL | 0 refills | Status: DC

## 2023-02-06 NOTE — Addendum Note (Signed)
Addended byBerniece Andreas K on: 02/06/2023 10:05 PM   Modules accepted: Orders

## 2023-02-07 NOTE — Telephone Encounter (Signed)
Attempted to inform pt. Left a detail message and to call us back if have any question.

## 2023-03-06 ENCOUNTER — Other Ambulatory Visit: Payer: Self-pay

## 2023-03-06 ENCOUNTER — Telehealth: Payer: Self-pay

## 2023-03-06 DIAGNOSIS — N9411 Superficial (introital) dyspareunia: Secondary | ICD-10-CM

## 2023-03-06 NOTE — Telephone Encounter (Signed)
Patient called because she has used two fills on the Clobetasol cream and sees know improvement in the area she is treating. Per Dr. Mackey Birchwood 12/20/22 office visit note "  F/U reevaluation and Bx if no improvement."  Patient was advised of this recommendation and agrees to scheduling visit. Message sent to appt pool to schedule and order placed for vulvar biopsy.

## 2023-03-12 ENCOUNTER — Other Ambulatory Visit: Payer: Self-pay | Admitting: Internal Medicine

## 2023-03-12 DIAGNOSIS — E785 Hyperlipidemia, unspecified: Secondary | ICD-10-CM

## 2023-04-20 ENCOUNTER — Encounter: Payer: 59 | Admitting: Internal Medicine

## 2023-04-26 ENCOUNTER — Ambulatory Visit: Payer: 59

## 2023-04-26 ENCOUNTER — Ambulatory Visit (INDEPENDENT_AMBULATORY_CARE_PROVIDER_SITE_OTHER): Payer: 59 | Admitting: Obstetrics & Gynecology

## 2023-04-26 ENCOUNTER — Encounter: Payer: Self-pay | Admitting: Obstetrics & Gynecology

## 2023-04-26 VITALS — BP 128/84 | HR 69

## 2023-04-26 DIAGNOSIS — N952 Postmenopausal atrophic vaginitis: Secondary | ICD-10-CM

## 2023-04-26 DIAGNOSIS — N9089 Other specified noninflammatory disorders of vulva and perineum: Secondary | ICD-10-CM | POA: Diagnosis not present

## 2023-04-26 NOTE — Progress Notes (Signed)
    KEMIYA BATDORF 1966-10-13 [id number]        57 y.o.  [id number]   RP: F/U on anterior vulvar white area just above the urethra  HPI: Postmenopausal, well on no HRT.  Resolved pain with intercourse at penetration anteriorly, only mild vaginal discomfort from dryness.  On vulvar exam in 12/2022, white area at anterior introitus just above urethra. Applied Clobetasol ointment 0.05% on-off since then.  Per patient who looked at it with a mirror, the area didn't change since 12/2022.   OB History  Gravida Para Term Preterm AB Living  4 2 2   2 2   SAB IAB Ectopic Multiple Live Births  2       2    # Outcome Date GA Lbr Len/2nd Weight Sex Delivery Anes PTL Lv  4 SAB           3 SAB           2 Term     M Vag-Spont  N LIV  1 Term     F Vag-Spont  N LIV    Past medical history,surgical history, problem list, medications, allergies, family history and social history were all reviewed and documented in the EPIC chart.   Directed ROS with pertinent positives and negatives documented in the history of present illness/assessment and plan.  Exam:  Vitals:   04/26/23 1454  BP: 128/84  Pulse: 69  SpO2: 97%   General appearance:  Normal  Gynecologic exam: Vulva: Normal except white flat area at anterior introitus just above urethra about 1 x 1 cm, no change x 12/2022.    Assessment/Plan:  57 y.o. [id number]   1. Vulvar lesion  Postmenopausal, well on no HRT.  Resolved pain with intercourse at penetration anteriorly, only mild vaginal discomfort from dryness.  On vulvar exam in 12/2022, white area at anterior introitus just above urethra. Applied Clobetasol ointment 0.05% on-off since then.  Per patient who looked at it with a mirror, the area didn't change since 12/2022. On gyn exam: Vulva: Normal except white flat area at anterior introitus just above urethra about 1 x 1 cm, no change x 12/2022.  Benign appearance, stable.  Probably d/t atrophy of menopause/area of skin on skin friction.  Decision  to observe, biopsy not indicated.  Will stop Clobetasol ointment.  Patient will contact us for reevaluation if the area changed or symptoms of irritation developed.   Genia Del MD, 3:15 PM 04/26/2023

## 2023-06-07 NOTE — Progress Notes (Signed)
No chief complaint on file.   HPI: Patient  Danielle Graham  57 y.o. comes in today for Preventive Health Care visit   Health Maintenance  Topic Date Due   Zoster Vaccines- Shingrix (1 of 2) Never done   COVID-19 Vaccine (3 - Moderna risk series) 03/09/2021   INFLUENZA VACCINE  06/01/2023   MAMMOGRAM  07/21/2023   PAP SMEAR-Modifier  12/21/2023   DTaP/Tdap/Td (4 - Td or Tdap) 05/23/2028   Colonoscopy  01/23/2030   Hepatitis C Screening  Completed   HIV Screening  Completed   HPV VACCINES  Aged Out   Health Maintenance Review LIFESTYLE:  Exercise:   Tobacco/ETS: Alcohol:  Sugar beverages: Sleep: Drug use: no HH of  Work:    ROS:  REST of 12 system review negative except as per HPI   Past Medical History:  Diagnosis Date   Allergic rhinitis, seasonal    HERPES GENITALIS 11/06/2009   Qualifier: Diagnosis of  By: Lawernce Ion, CMA (AAMA), Carollee Herter S    History of chicken pox    History of colonic polyps    pre cancer   HSV (herpes simplex virus) infection    HYPERGLYCEMIA, FASTING 03/22/2010   Qualifier: Diagnosis of  By: Fabian Sharp MD, Neta Mends    Hyperlipidemia    NSVD (normal spontaneous vaginal delivery)    x2   Overactive bladder    Urinary incontinence     Past Surgical History:  Procedure Laterality Date   BREAST BIOPSY  11/1999   right lumpectomy..fibroadenoma   COLONOSCOPY  March 26th, 2021   DILATION AND CURETTAGE OF UTERUS     miscarriage 1999..x2   Eosinophilic Esophagitis Biopsy  01/24/2020   [facility]   MYOMECTOMY     REFRACTIVE SURGERY Left    TONSILLECTOMY  1971   UPPER GI ENDOSCOPY  March 26th, 2021    Family History  Problem Relation Age of Onset   Asthma Mother    Colon cancer Father        deceased   Cancer Father 39       colon   Heart disease Paternal Grandmother        65 s   Other Other        ELEVated WBC count in half sister   Breast cancer Maternal Aunt     Social History   Socioeconomic History   Marital  status: Married    Spouse name: Not on file   Number of children: Not on file   Years of education: Not on file   Highest education level: Bachelor's degree (e.g., BA, AB, BS)  Occupational History   Not on file  Tobacco Use   Smoking status: Former    Current packs/day: 0.00    Types: Cigarettes    Quit date: 09/27/2008    Years since quitting: 14.7   Smokeless tobacco: Never  Vaping Use   Vaping status: Never Used  Substance and Sexual Activity   Alcohol use: Yes    Comment: 1 glass of wine daily   Drug use: No   Sexual activity: Yes    Partners: Male    Birth control/protection: Post-menopausal, Other-see comments    Comment: 1st intercourse- 15, partners- 2, husband vasectomy  Other Topics Concern   Not on file  Social History Narrative   Married   HH of 3  1 dog    2 children and husband   1 dog   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        Has smoke detector and wears seat belts.  No firearms stored safely. . No excess sun exposure. Sees dentist regularly . No depression   G4P2            Social Determinants of Health   Financial Resource Strain: Low Risk  (09/28/2022)   Overall Financial Resource Strain (CARDIA)    Difficulty of Paying Living Expenses: Not hard at all  Food Insecurity: No Food Insecurity (09/28/2022)   Hunger Vital Sign    Worried About Running Out of Food in the Last Year: Never true    Ran Out of Food in the Last Year: Never true  Transportation Needs: No Transportation Needs (09/28/2022)   PRAPARE - Administrator, Civil Service (Medical): No    Lack of Transportation (Non-Medical): No  Physical Activity: Insufficiently Active (09/28/2022)   Exercise Vital Sign    Days of Exercise per Week: 1 day    Minutes of Exercise per Session: 50 min  Stress: No Stress Concern Present (09/28/2022)   [employer] of Occupational Health - Occupational  Stress Questionnaire    Feeling of Stress : Only a little  Social Connections: Socially Integrated (09/28/2022)   Social Connection and Isolation Panel [NHANES]    Frequency of Communication with Friends and Family: Three times a week    Frequency of Social Gatherings with Friends and Family: Once a week    Attends Religious Services: More than 4 times per year    Active Member of [employer] or Organizations: Yes    Attends Banker Meetings: 1 to 4 times per year    Marital Status: Married    Outpatient Medications Prior to Visit  Medication Sig Dispense Refill   ARNUITY ELLIPTA 100 MCG/ACT AEPB  (Patient not taking: Reported on 12/20/2022)     aspirin 81 MG tablet Take 81 mg by mouth daily.     CALCIUM PO Take by mouth.     clobetasol ointment (TEMOVATE) 0.05 % Apply 1 Application topically 2 (two) times daily. Thin application on affected vulva twice a day for 1 week, then HS for 1 week, then every other day x 2 weeks. (Patient not taking: Reported on 04/26/2023) 30 g 0   Multiple Vitamin (MULTIVITAMIN PO) Take by mouth.     Omeprazole 20 MG TBDD      rosuvastatin (CRESTOR) 10 MG tablet TAKE 1 TABLET BY MOUTH EVERY DAY 30 tablet 5   No facility-administered medications prior to visit.     EXAM:  LMP 05/06/2019 Comment: sexually active  There is no height or weight on file to calculate BMI. Wt Readings from Last 3 Encounters:  12/20/22 139 lb (63 kg)  09/28/22 139 lb 3.2 oz (63.1 kg)  03/30/22 137 lb 12.8 oz (62.5 kg)    Physical Exam: Vital signs reviewed ZOX:WRUE is a well-developed well-nourished alert cooperative    who appearsr stated age in no acute distress.  HEENT: normocephalic atraumatic , Eyes: PERRL EOM's full, conjunctiva clear, Nares: paten,t no deformity discharge or tenderness., Ears: no deformity EAC's clear TMs with normal landmarks. Mouth: clear OP, no lesions, edema.  Moist mucous membranes. Dentition in adequate repair. NECK: supple without masses,  thyromegaly or bruits. CHEST/PULM:  Clear to auscultation and percussion breath sounds equal no wheeze , rales or rhonchi. No chest wall deformities or tenderness. Breast: normal  by inspection . No dimpling, discharge, masses, tenderness or discharge . CV: PMI is nondisplaced, S1 S2 no gallops, murmurs, rubs. Peripheral pulses are full without delay.No JVD .  ABDOMEN: Bowel sounds normal nontender  No guard or rebound, no hepato splenomegal no CVA tenderness.  No hernia. Extremtities:  No clubbing cyanosis or edema, no acute joint swelling or redness no focal atrophy NEURO:  Oriented x3, cranial nerves 3-12 appear to be intact, no obvious focal weakness,gait within normal limits no abnormal reflexes or asymmetrical SKIN: No acute rashes normal turgor, color, no bruising or petechiae. PSYCH: Oriented, good eye contact, no obvious depression anxiety, cognition and judgment appear normal. LN: no cervical axillary adenopathy  Lab Results  Component Value Date   WBC 4.6 03/30/2022   HGB 13.5 03/30/2022   HCT 40.3 03/30/2022   PLT 297.0 03/30/2022   GLUCOSE 91 03/30/2022   CHOL 213 (H) 03/30/2022   TRIG 75.0 03/30/2022   HDL 85.10 03/30/2022   LDLDIRECT 148.9 11/13/2012   LDLCALC 113 (H) 03/30/2022   ALT 15 03/30/2022   AST 19 03/30/2022   NA 141 03/30/2022   K 3.9 03/30/2022   CL 103 03/30/2022   CREATININE 0.80 03/30/2022   BUN 11 03/30/2022   CO2 29 03/30/2022   TSH 1.47 03/30/2022   HGBA1C 6.0 03/30/2022    BP Readings from Last 3 Encounters:  04/26/23 128/84  12/20/22 100/64  09/28/22 116/76    Lab plan  reviewed with patient   ASSESSMENT AND PLAN:  Discussed the following assessment and plan:    ICD-10-CM   1. Visit for preventive health examination  Z00.00     2. Hyperlipidemia, unspecified hyperlipidemia type  E78.5     3. Medication management  Z79.899      No follow-ups on file.  Patient Care Team: Alyssabeth Bruster, Neta Mends, MD as PCP - General Janalyn Harder, MD  (Inactive) (Dermatology) Charna Elizabeth, MD as Consulting Physician (Gastroenterology) Genia Del, MD as Consulting Physician (Obstetrics and Gynecology) There are no Patient Instructions on file for this visit.  Neta Mends. Zyaire Dumas M.D.

## 2023-06-08 ENCOUNTER — Ambulatory Visit: Payer: 59 | Admitting: Internal Medicine

## 2023-06-08 ENCOUNTER — Encounter: Payer: Self-pay | Admitting: Internal Medicine

## 2023-06-08 VITALS — BP 120/78 | HR 67 | Temp 98.3°F | Ht 63.9 in | Wt 138.6 lb

## 2023-06-08 DIAGNOSIS — Z0000 Encounter for general adult medical examination without abnormal findings: Secondary | ICD-10-CM

## 2023-06-08 DIAGNOSIS — E785 Hyperlipidemia, unspecified: Secondary | ICD-10-CM | POA: Diagnosis not present

## 2023-06-08 DIAGNOSIS — Z79899 Other long term (current) drug therapy: Secondary | ICD-10-CM

## 2023-06-08 DIAGNOSIS — K200 Eosinophilic esophagitis: Secondary | ICD-10-CM

## 2023-06-08 DIAGNOSIS — R41840 Attention and concentration deficit: Secondary | ICD-10-CM

## 2023-06-08 LAB — COMPREHENSIVE METABOLIC PANEL
ALT: 15 U/L (ref 0–35)
AST: 21 U/L (ref 0–37)
Albumin: 4.7 g/dL (ref 3.5–5.2)
Alkaline Phosphatase: 58 U/L (ref 39–117)
BUN: 17 mg/dL (ref 6–23)
CO2: 31 mEq/L (ref 19–32)
Calcium: 10 mg/dL (ref 8.4–10.5)
Chloride: 101 mEq/L (ref 96–112)
Creatinine, Ser: 0.77 mg/dL (ref 0.40–1.20)
GFR: 84.38 mL/min (ref >60.00)
Glucose, Bld: 110 mg/dL — ABNORMAL HIGH (ref 70–99)
Potassium: 4.5 mEq/L (ref 3.5–5.1)
Sodium: 140 mEq/L (ref 135–145)
Total Bilirubin: 0.7 mg/dL (ref 0.2–1.2)
Total Protein: 7.3 g/dL (ref 6.0–8.3)

## 2023-06-08 LAB — CBC WITH DIFFERENTIAL/PLATELET
Basophils Absolute: 0 10*3/uL (ref 0.0–0.1)
Basophils Relative: 0.9 % (ref 0.0–3.0)
Eosinophils Absolute: 0.2 10*3/uL (ref 0.0–0.7)
Eosinophils Relative: 4.1 % (ref 0.0–5.0)
HCT: 43.7 % (ref 36.0–46.0)
Hemoglobin: 14.2 g/dL (ref 12.0–15.0)
Lymphocytes Relative: 29.7 % (ref 12.0–46.0)
Lymphs Abs: 1.6 10*3/uL (ref 0.7–4.0)
MCHC: 32.5 g/dL (ref 30.0–36.0)
MCV: 91.8 fl (ref 78.0–100.0)
Monocytes Absolute: 0.5 10*3/uL (ref 0.1–1.0)
Monocytes Relative: 9.1 % (ref 3.0–12.0)
Neutro Abs: 3.1 10*3/uL (ref 1.4–7.7)
Neutrophils Relative %: 56.2 % (ref 43.0–77.0)
Platelets: 335 10*3/uL (ref 150.0–400.0)
RBC: 4.76 Mil/uL (ref 3.87–5.11)
RDW: 14 % (ref 11.5–15.5)
WBC: 5.5 10*3/uL (ref 4.0–10.5)

## 2023-06-08 LAB — TSH: TSH: 1.44 u[IU]/mL (ref 0.35–5.50)

## 2023-06-08 LAB — LIPID PANEL
Cholesterol: 224 mg/dL — ABNORMAL HIGH (ref 0–200)
HDL: 90.8 mg/dL (ref >39.00)
LDL Cholesterol: 112 mg/dL — ABNORMAL HIGH (ref 0–99)
NonHDL: 133.53
Total CHOL/HDL Ratio: 2
Triglycerides: 108 mg/dL (ref 0.0–149.0)
VLDL: 21.6 mg/dL (ref 0.0–40.0)

## 2023-06-08 LAB — HEMOGLOBIN A1C: Hgb A1c MFr Bld: 5.9 % (ref 4.6–6.5)

## 2023-06-08 LAB — VITAMIN B12: Vitamin B-12: 412 pg/mL (ref 211–911)

## 2023-06-08 NOTE — Patient Instructions (Addendum)
Good to see you today . Lab pending and will get form completed and sent  Shingles vaccine  appt here or at pharmacy

## 2023-06-08 NOTE — Progress Notes (Signed)
Blood sugar fasting is somewhat elevated but no diabetes.   B12 is in good range  Lipo a  pending . Form will be completed and  and faxed next week. Marland Kitchen

## 2023-06-13 NOTE — Progress Notes (Signed)
Lipo a is optimal  level reassuring

## 2023-06-20 ENCOUNTER — Telehealth: Payer: Self-pay

## 2023-06-20 NOTE — Telephone Encounter (Signed)
Physical Results Form for [employer] is faxed in with a confirmation.

## 2023-07-25 LAB — HM MAMMOGRAPHY

## 2023-07-26 ENCOUNTER — Encounter: Payer: Self-pay | Admitting: Internal Medicine

## 2023-09-07 ENCOUNTER — Other Ambulatory Visit: Payer: Self-pay | Admitting: Family

## 2023-09-07 DIAGNOSIS — E785 Hyperlipidemia, unspecified: Secondary | ICD-10-CM

## 2023-09-11 NOTE — Telephone Encounter (Signed)
Re-directing to Panosh. Patient has 1 pill left

## 2024-02-29 ENCOUNTER — Other Ambulatory Visit: Payer: Self-pay | Admitting: Family

## 2024-02-29 DIAGNOSIS — E785 Hyperlipidemia, unspecified: Secondary | ICD-10-CM

## 2024-03-05 ENCOUNTER — Other Ambulatory Visit: Payer: Self-pay | Admitting: Internal Medicine

## 2024-03-05 DIAGNOSIS — E785 Hyperlipidemia, unspecified: Secondary | ICD-10-CM

## 2024-03-05 MED ORDER — ROSUVASTATIN CALCIUM 10 MG PO TABS
10.0000 mg | ORAL_TABLET | Freq: Every day | ORAL | 5 refills | Status: DC
Start: 2024-03-05 — End: 2024-08-29

## 2024-03-05 NOTE — Telephone Encounter (Signed)
 Copied from CRM [id number]. Topic: Clinical - Prescription Issue >> Mar 05, 2024 10:52 AM Martinique E wrote: Reason for CRM: Patient called in stating that her pharmacy has been trying to refill the patient's rosuvastatin  (CRESTOR ) 10 MG tablet since 5/1. Patient stated she is not due for a physical until June so she is not sure why this has not been refilled yet. Callback number for patient is [phone].

## 2024-05-06 ENCOUNTER — Ambulatory Visit: Payer: Self-pay

## 2024-05-06 ENCOUNTER — Ambulatory Visit (INDEPENDENT_AMBULATORY_CARE_PROVIDER_SITE_OTHER): Admitting: Family Medicine

## 2024-05-06 VITALS — BP 124/60 | HR 70 | Temp 98.2°F | Wt 137.6 lb

## 2024-05-06 DIAGNOSIS — H938X1 Other specified disorders of right ear: Secondary | ICD-10-CM | POA: Diagnosis not present

## 2024-05-06 NOTE — Patient Instructions (Signed)
 No signs of ear infection.  Follow up for any progressive ear pain, hearing loss, or other concerns.

## 2024-05-06 NOTE — Telephone Encounter (Signed)
 FYI Only or Action Required?: FYI only for provider.  Patient was last seen in primary care on 06/08/2023 by Panosh, Apolinar POUR, MD. Called Nurse Triage reporting Ear Fullness. Symptoms began several days ago. Interventions attempted: Nothing. Symptoms are: unchanged.  Triage Disposition: See PCP When Office is Open (Within 3 Days)  Patient/caregiver understands and will follow disposition?: Yes  Appt scheduled today 115 with Dr. Micheal.   Message from Raven B sent at 05/06/2024  9:51 AM EDT  Patient advised she has water stuck in her ear and wants to know how to proceed with treatment. Patient call back number is [phone].   Reason for Disposition  Diagnosis is uncertain  Answer Assessment - Initial Assessment Questions 1. LOCATION: Which ear is involved?      R ear  2. SYMPTOMS: What are the main symptoms? (e.g., pain, redness, itching, discharge)     Feeling like water in ear  3. MOVEMENT: Does the pain increase when the ear is moved up and down? Does pushing on the tab of tissue in the front of the ear increase the pain?      no 4. PAIN: How bad is the pain?  (Scale 1-10; mild, moderate or severe)   - MILD (1-3): doesn't interfere with normal activities    - MODERATE (4-7): interferes with normal activities or awakens from sleep    - SEVERE (8-10): excruciating pain, unable to do any normal activities      Mild some pressure  5. ONSET: When did the ear symptoms start?      Last Thursday/ Friday  6. DISCHARGE: Is there any discharge? What color is it?      no 7. SWIMMING: Have you been swimming recently? If Yes, ask: How often do you swim? Is it in a pool, lake or ocean?      no 8. COTTON EAR SWABS: Do you use cotton ear swabs (Q-tips)? How often? (e.g., never, daily, weekly)     Every morning  Protocols used: Ear - Swimmer's-A-AH

## 2024-05-06 NOTE — Progress Notes (Signed)
 Established Patient Office Visit  Subjective   Patient ID: Danielle Graham, female    DOB: December 19, 1965  Age: 58 y.o. MRN: [id number]  Chief Complaint  Patient presents with   Ear Problem    HPI   Danielle Graham is seen as a work in today with right ear symptoms for about 4 days.  She states this is not really so much of a pain as sensation of pressure or possibly water in her ear.  No actual drainage.  No fevers or chills.  No hearing loss.  No tinnitus or vertigo.  Denies any sore throat symptoms.  No recent flying.  No significant sinus or nasal congestion.  No recent swimming.  Past Medical History:  Diagnosis Date   Allergic rhinitis, seasonal    HERPES GENITALIS 11/06/2009   Qualifier: Diagnosis of  By: Laurice, CMA (AAMA), Clotilda RAMAN    History of chicken pox    History of colonic polyps    pre cancer   HSV (herpes simplex virus) infection    HYPERGLYCEMIA, FASTING 03/22/2010   Qualifier: Diagnosis of  By: Charlett MD, Apolinar POUR    Hyperlipidemia    NSVD (normal spontaneous vaginal delivery)    x2   Overactive bladder    Urinary incontinence    Past Surgical History:  Procedure Laterality Date   BREAST BIOPSY  11/1999   right lumpectomy..fibroadenoma   COLONOSCOPY  March 26th, 2021   DILATION AND CURETTAGE OF UTERUS     miscarriage 1999..x2   Eosinophilic Esophagitis Biopsy  01/24/2020   [facility]   MYOMECTOMY     REFRACTIVE SURGERY Left    TONSILLECTOMY  1971   UPPER GI ENDOSCOPY  March 26th, 2021    reports that she quit smoking about 15 years ago. Her smoking use included cigarettes. She has never used smokeless tobacco. She reports current alcohol use. She reports that she does not use drugs. family history includes Asthma in her mother; Breast cancer in her maternal aunt; Cancer (age of onset: 6) in her father; Colon cancer in her father; Heart disease in her paternal grandmother; Other in an other family member. Allergies  Allergen Reactions   Thimerosal  (Thiomersal)     REACTION: from contact  solution  .    Review of Systems  Constitutional:  Negative for chills and fever.  HENT:  Negative for congestion, ear discharge, hearing loss, sinus pain and tinnitus.       Objective:     BP 124/60 (BP Location: Left Arm, Patient Position: Sitting, Cuff Size: Normal)   Pulse 70   Temp 98.2 F (36.8 C) (Oral)   Wt 137 lb 9.6 oz (62.4 kg)   LMP 05/06/2019 Comment: sexually active  SpO2 98%   BMI 23.69 kg/m  BP Readings from Last 3 Encounters:  05/06/24 124/60  06/08/23 120/78  04/26/23 128/84   Wt Readings from Last 3 Encounters:  05/06/24 137 lb 9.6 oz (62.4 kg)  06/08/23 138 lb 9.6 oz (62.9 kg)  12/20/22 139 lb (63 kg)      Physical Exam Vitals reviewed.  Constitutional:      General: She is not in acute distress.    Appearance: She is not ill-appearing.  HENT:     Right Ear: Tympanic membrane, ear canal and external ear normal.     Left Ear: Tympanic membrane, ear canal and external ear normal.  Cardiovascular:     Rate and Rhythm: Normal rate and regular rhythm.  Neurological:  Mental Status: She is alert.      No results found for any visits on 05/06/24.    The 10-year ASCVD risk score (Arnett DK, et al., 2019) is: 2.4%    Assessment & Plan:   Abnormal sensation of right ear pressure past 4 days.  No visible effusion.  No erythema.  Eardrum appears completely normal.  Canals normal.  No evidence for otitis media or externa.  No cerumen.  Discussed that she may have element of eustachian tube dysfunction.  Discussed limitations of medications in treating this.  She has no nasal congestion currently.  Observe for now.  Follow-up for any hearing changes, ear pain, drainage, or other concern.  Wolm Scarlet, MD

## 2024-05-14 ENCOUNTER — Telehealth: Payer: Self-pay | Admitting: Obstetrics and Gynecology

## 2024-05-14 NOTE — Telephone Encounter (Signed)
 This is Dr. Nikki doing chart recall for Dr. [place] patients.   Please contact patient who is due for annual exam recall.

## 2024-05-15 NOTE — Telephone Encounter (Signed)
 Spoke with patient. Patient has established care with another provider/location.   Removed from current recall.   Encounter closed.

## 2024-06-05 ENCOUNTER — Telehealth: Payer: Self-pay

## 2024-06-05 DIAGNOSIS — E785 Hyperlipidemia, unspecified: Secondary | ICD-10-CM

## 2024-06-05 DIAGNOSIS — Z0000 Encounter for general adult medical examination without abnormal findings: Secondary | ICD-10-CM

## 2024-06-05 DIAGNOSIS — Z79899 Other long term (current) drug therapy: Secondary | ICD-10-CM

## 2024-06-05 NOTE — Addendum Note (Signed)
 Addended byBETHA CHARLETT APOLINAR MARLA on: 06/05/2024 07:32 PM   Modules accepted: Orders

## 2024-06-05 NOTE — Telephone Encounter (Signed)
 Copied from CRM #[id number]. Topic: Clinical - Request for Lab/Test Order >> Jun 04, 2024 11:39 AM Danielle Graham wrote: Reason for CRM: Patient wanting to get blood work done next week before her physical, she will be in town-needing to get an order for it. Please reach out as soon as possible

## 2024-06-06 NOTE — Telephone Encounter (Signed)
 Contacted pt and lab appt is made.

## 2024-06-12 ENCOUNTER — Other Ambulatory Visit

## 2024-06-12 DIAGNOSIS — Z79899 Other long term (current) drug therapy: Secondary | ICD-10-CM

## 2024-06-12 DIAGNOSIS — Z0000 Encounter for general adult medical examination without abnormal findings: Secondary | ICD-10-CM

## 2024-06-12 DIAGNOSIS — E785 Hyperlipidemia, unspecified: Secondary | ICD-10-CM

## 2024-06-12 LAB — HEPATIC FUNCTION PANEL
ALT: 16 U/L (ref 0–35)
AST: 21 U/L (ref 0–37)
Albumin: 4.2 g/dL (ref 3.5–5.2)
Alkaline Phosphatase: 46 U/L (ref 39–117)
Bilirubin, Direct: 0.1 mg/dL (ref 0.0–0.3)
Total Bilirubin: 0.6 mg/dL (ref 0.2–1.2)
Total Protein: 6.6 g/dL (ref 6.0–8.3)

## 2024-06-12 LAB — BASIC METABOLIC PANEL WITH GFR
BUN: 16 mg/dL (ref 6–23)
CO2: 31 meq/L (ref 19–32)
Calcium: 9.2 mg/dL (ref 8.4–10.5)
Chloride: 104 meq/L (ref 96–112)
Creatinine, Ser: 0.74 mg/dL (ref 0.40–1.20)
GFR: 87.88 mL/min (ref >60.00)
Glucose, Bld: 103 mg/dL — ABNORMAL HIGH (ref 70–99)
Potassium: 4.6 meq/L (ref 3.5–5.1)
Sodium: 141 meq/L (ref 135–145)

## 2024-06-12 LAB — CBC WITH DIFFERENTIAL/PLATELET
Basophils Absolute: 0.1 K/uL (ref 0.0–0.1)
Basophils Relative: 1.1 % (ref 0.0–3.0)
Eosinophils Absolute: 0.2 K/uL (ref 0.0–0.7)
Eosinophils Relative: 4.1 % (ref 0.0–5.0)
HCT: 41.8 % (ref 36.0–46.0)
Hemoglobin: 13.7 g/dL (ref 12.0–15.0)
Lymphocytes Relative: 24.8 % (ref 12.0–46.0)
Lymphs Abs: 1.4 K/uL (ref 0.7–4.0)
MCHC: 32.8 g/dL (ref 30.0–36.0)
MCV: 91 fl (ref 78.0–100.0)
Monocytes Absolute: 0.4 K/uL (ref 0.1–1.0)
Monocytes Relative: 8 % (ref 3.0–12.0)
Neutro Abs: 3.5 K/uL (ref 1.4–7.7)
Neutrophils Relative %: 62 % (ref 43.0–77.0)
Platelets: 311 K/uL (ref 150.0–400.0)
RBC: 4.59 Mil/uL (ref 3.87–5.11)
RDW: 14.9 % (ref 11.5–15.5)
WBC: 5.6 K/uL (ref 4.0–10.5)

## 2024-06-12 LAB — LIPID PANEL
Cholesterol: 206 mg/dL — ABNORMAL HIGH (ref 0–200)
HDL: 82.4 mg/dL (ref >39.00)
LDL Cholesterol: 109 mg/dL — ABNORMAL HIGH (ref 0–99)
NonHDL: 123.52
Total CHOL/HDL Ratio: 2
Triglycerides: 73 mg/dL (ref 0.0–149.0)
VLDL: 14.6 mg/dL (ref 0.0–40.0)

## 2024-06-12 LAB — HEMOGLOBIN A1C: Hgb A1c MFr Bld: 6.3 % (ref 4.6–6.5)

## 2024-06-12 LAB — TSH: TSH: 1.33 u[IU]/mL (ref 0.35–5.50)

## 2024-06-26 ENCOUNTER — Ambulatory Visit: Payer: Self-pay | Admitting: Internal Medicine

## 2024-06-26 NOTE — Progress Notes (Signed)
 Results fairly stable  A1c in pre diabetic range  Will review at upcoming visit

## 2024-07-02 ENCOUNTER — Encounter: Admitting: Internal Medicine

## 2024-07-03 ENCOUNTER — Encounter: Admitting: Internal Medicine

## 2024-08-29 ENCOUNTER — Other Ambulatory Visit: Payer: Self-pay | Admitting: Internal Medicine

## 2024-08-29 DIAGNOSIS — E785 Hyperlipidemia, unspecified: Secondary | ICD-10-CM

## 2024-09-11 ENCOUNTER — Encounter: Payer: Self-pay | Admitting: Internal Medicine

## 2024-09-11 ENCOUNTER — Ambulatory Visit (INDEPENDENT_AMBULATORY_CARE_PROVIDER_SITE_OTHER): Admitting: Internal Medicine

## 2024-09-11 VITALS — BP 106/76 | HR 65 | Temp 97.9°F | Ht 63.35 in | Wt 134.7 lb

## 2024-09-11 DIAGNOSIS — Z23 Encounter for immunization: Secondary | ICD-10-CM | POA: Diagnosis not present

## 2024-09-11 DIAGNOSIS — R7301 Impaired fasting glucose: Secondary | ICD-10-CM

## 2024-09-11 DIAGNOSIS — Z79899 Other long term (current) drug therapy: Secondary | ICD-10-CM | POA: Diagnosis not present

## 2024-09-11 DIAGNOSIS — E785 Hyperlipidemia, unspecified: Secondary | ICD-10-CM

## 2024-09-11 DIAGNOSIS — K200 Eosinophilic esophagitis: Secondary | ICD-10-CM

## 2024-09-11 DIAGNOSIS — Z0000 Encounter for general adult medical examination without abnormal findings: Secondary | ICD-10-CM

## 2024-09-11 NOTE — Patient Instructions (Addendum)
  Good to see  you today   Follow  blood sugars  consider trying a CGM like free style libre or the OTc version  Call if you want a RX sent .   Can chose to do a hg A1c in 6-12 months     Agree fu with gi for endoscopy  below is  from UP TO DATE     can asks dr Kristie about budesonide  instead for fluticasone prep . And  options   Budesonide -- For adults and pediatric patients who are 60 years of age and older, the FDA-approved dose of budesonide oral suspension is 2 mg, twice daily, for 12 weeks [23,24]. Patients are instructed to take budesonide slowly, over 5 to 10 minutes, and not to eat or drink for 30 minutes after taking the medication. This budesonide formulation provides consistent drug delivery [23]. For children who are 60 years of age or younger, budesonide can be administered as an oral viscous slurry (1 to 2 mg total daily dose, generally divided into twice a day). Viscous budesonide can be compounded by mixing Pulmicort Respules with sucralose (Splenda; 10 1-gram packets per 1 mg of budesonide, creating a volume of approximately 8 mL) or another carrier vehicle that is not liquid [30]. Budesonide can also be administered as an orodispersible tablet, although this formulation may not be available in all regions [31,32].   Treatment with budesonide is generally well tolerated, and patients who respond will typically have symptomatic improvement within several days [33]. Budesonide induction therapy is followed by assessment of symptomatic response [6]. We perform upper endoscopy 8 to 12 weeks after initiating therapy to assess for endoscopic and histologic improvement [4]. (See Clinical manifestations and diagnosis of eosinophilic esophagitis (EoE), section on 'Defining disease severity'.)

## 2024-09-11 NOTE — Progress Notes (Signed)
 Chief Complaint  Patient presents with   Annual Exam    HPI: Patient  Danielle Graham  58 y.o. comes in today for Preventive Health Care visit  And med check  No major health changes  Has moved to [facility] and doing well. Asks about elevated bg results from August blood work.  No hx dm family  or other  . Diet no sig change.  Physically active  Sees Gi for eoe   anuity not as helpful as flovent from the past now off market  to get panendo in spring . Has some dysphagia sx  ( food gets stuck)   Health Maintenance  Topic Date Due   Pneumococcal Vaccine: 50+ Years (1 of 1 - PCV) Never done   COVID-19 Vaccine (3 - Moderna risk series) 09/27/2024 (Originally 03/09/2021)   Cervical Cancer Screening (Pap smear)  03/11/2025 (Originally 12/21/2023)   Mammogram  03/11/2025 (Originally 07/24/2024)   Zoster Vaccines- Shingrix (1 of 2) 06/11/2025 (Originally 12/05/1982)   DTaP/Tdap/Td (4 - Td or Tdap) 05/23/2028   Colonoscopy  01/23/2030   Influenza Vaccine  Completed   Hepatitis C Screening  Completed   HIV Screening  Completed   Hepatitis B Vaccines 19-59 Average Risk  Aged Out   HPV VACCINES  Aged Out   Meningococcal B Vaccine  Aged Out   Health Maintenance Review LIFESTYLE:  Exercise:   getting better    [place]   pickelball   Tobacco/ETS: no Alcohol:  wine  Sugar beverages:no Sleep: about 8 hours  Drug use: no HH of  2  no pets  Work: 8-5 m - Fridays    ROS:  GEN/ HEENT: No fever, significant weight changes sweats headaches vision problems hearing changes, CV/ PULM; No chest pain shortness of breath cough, syncope,edema  change in exercise tolerance. GI /GU: No adominal pain, vomiting, change in bowel habits. No blood in the stool. No significant GU symptoms. SKIN/HEME: ,no acute skin rashes suspicious lesions or bleeding. No lymphadenopathy, nodules, masses.  NEURO/ PSYCH:  No neurologic signs such as weakness numbness. No depression anxiety. IMM/  Allergy: No unusual infections.  Allergy .   REST of 12 system review negative except as per HPI   Past Medical History:  Diagnosis Date   Allergic rhinitis, seasonal    HERPES GENITALIS 11/06/2009   Qualifier: Diagnosis of  By: Laurice, CMA (AAMA), Clotilda RAMAN    History of chicken pox    History of colonic polyps    pre cancer   HSV (herpes simplex virus) infection    HYPERGLYCEMIA, FASTING 03/22/2010   Qualifier: Diagnosis of  By: Charlett MD, Apolinar POUR    Hyperlipidemia    NSVD (normal spontaneous vaginal delivery)    x2   Overactive bladder    Urinary incontinence     Past Surgical History:  Procedure Laterality Date   BREAST BIOPSY  11/1999   right lumpectomy..fibroadenoma   COLONOSCOPY  March 26th, 2021   DILATION AND CURETTAGE OF UTERUS     miscarriage 1999..x2   Eosinophilic Esophagitis Biopsy  01/24/2020   [facility]   MYOMECTOMY     REFRACTIVE SURGERY Left    TONSILLECTOMY  1971   UPPER GI ENDOSCOPY  March 26th, 2021    Family History  Problem Relation Age of Onset   Asthma Mother    Colon cancer Father        deceased   Cancer Father 27  colon   Heart disease Paternal Grandmother        69 s   Other Other        ELEVated WBC count in half sister   Breast cancer Maternal Aunt     Social History   Socioeconomic History   Marital status: Married    Spouse name: Not on file   Number of children: Not on file   Years of education: Not on file   Highest education level: Bachelor's degree (e.g., BA, AB, BS)  Occupational History   Not on file  Tobacco Use   Smoking status: Former    Current packs/day: 0.00    Types: Cigarettes    Quit date: 09/27/2008    Years since quitting: 15.9   Smokeless tobacco: Never  Vaping Use   Vaping status: Never Used  Substance and Sexual Activity   Alcohol use: Yes    Comment: 1 glass of wine daily   Drug use: No   Sexual activity: Yes    Partners: Male    Birth control/protection:  Post-menopausal, Other-see comments    Comment: 1st intercourse- 15, partners- 2, husband vasectomy  Other Topics Concern   Not on file  Social History Narrative   Married   HH of 3  1 dog    2 children and husband   1 dog   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        Has smoke detector and wears seat belts.  No firearms stored safely. . No excess sun exposure. Sees dentist regularly . No depression   G4P2            Social Drivers of Corporate Investment Banker Strain: Low Risk  (05/06/2024)   Overall Financial Resource Strain (CARDIA)    Difficulty of Paying Living Expenses: Not hard at all  Food Insecurity: No Food Insecurity (05/06/2024)   Hunger Vital Sign    Worried About Running Out of Food in the Last Year: Never true    Ran Out of Food in the Last Year: Never true  Transportation Needs: No Transportation Needs (05/06/2024)   PRAPARE - Administrator, Civil Service (Medical): No    Lack of Transportation (Non-Medical): No  Physical Activity: Insufficiently Active (05/06/2024)   Exercise Vital Sign    Days of Exercise per Week: 1 day    Minutes of Exercise per Session: 30 min  Stress: Stress Concern Present (05/06/2024)   [employer] of Occupational Health - Occupational Stress Questionnaire    Feeling of Stress: Rather much  Social Connections: Socially Integrated (05/06/2024)   Social Connection and Isolation Panel    Frequency of Communication with Friends and Family: More than three times a week    Frequency of Social Gatherings with Friends and Family: Once a week    Attends Religious Services: More than 4 times per year    Active Member of [employer] or Organizations: Yes    Attends Engineer, Structural: More than 4 times per year    Marital Status: Married    Outpatient Medications Prior to Visit  Medication Sig Dispense Refill   ARNUITY ELLIPTA 100 MCG/ACT AEPB       aspirin 81 MG tablet Take 81 mg by mouth daily.     CALCIUM  PO Take by mouth.     estradiol (ESTRACE) 0.01 % CREA vaginal cream  Place 1 Applicatorful vaginally 3 (three) times a week.     Multiple Vitamin (MULTIVITAMIN PO) Take by mouth.     Omeprazole 20 MG TBDD      rosuvastatin  (CRESTOR ) 10 MG tablet TAKE 1 TABLET BY MOUTH EVERY DAY 30 tablet 2   YUVAFEM 10 MCG TABS vaginal tablet Place 1 tablet vaginally 2 (two) times a week.     No facility-administered medications prior to visit.     EXAM:  BP 106/76 (BP Location: Left Arm, Patient Position: Sitting, Cuff Size: Normal)   Pulse 65   Temp 97.9 F (36.6 C) (Oral)   Ht 5' 3.35 (1.609 m)   Wt 134 lb 11.2 oz (61.1 kg)   LMP 05/06/2019 Comment: sexually active  SpO2 96%   BMI 23.60 kg/m   Body mass index is 23.6 kg/m. Wt Readings from Last 3 Encounters:  09/11/24 134 lb 11.2 oz (61.1 kg)  05/06/24 137 lb 9.6 oz (62.4 kg)  06/08/23 138 lb 9.6 oz (62.9 kg)    Physical Exam: Vital signs reviewed HZW:Uypd is a well-developed well-nourished alert cooperative    who appearsr stated age in no acute distress.  HEENT: normocephalic atraumatic , Eyes: PERRL EOM's full, conjunctiva clear, Nares: paten,t no deformity discharge or tenderness., Ears: no deformity EAC's clear TMs with normal landmarks. Mouth: clear OP, no lesions, edema.  Moist mucous membranes. Dentition in adequate repair. NECK: supple without masses, thyromegaly or bruits. CHEST/PULM:  Clear to auscultation and percussion breath sounds equal no wheeze , rales or rhonchi. No chest wall deformities or tenderness. Breast: normal by inspection . No dimpling, discharge, masses, tenderness or discharge . CV: PMI is nondisplaced, S1 S2 no gallops, murmurs, rubs. Peripheral pulses are full without delay.No JVD .  ABDOMEN: Bowel sounds normal nontender  No guard or rebound, no hepato splenomegal no CVA tenderness.  No hernia. Extremtities:  No clubbing cyanosis or edema, no  acute joint swelling or redness no focal atrophy NEURO:  Oriented x3, cranial nerves 3-12 appear to be intact, no obvious focal weakness,gait within normal limits no abnormal reflexes or asymmetrical SKIN: No acute rashes normal turgor, color, no bruising or petechiae.  Cryo sites  ( went to derm)_  PSYCH: Oriented, good eye contact, no obvious depression anxiety, cognition and judgment appear normal. LN: no cervical axillary adenopathy  Lab Results  Component Value Date   WBC 5.6 06/12/2024   HGB 13.7 06/12/2024   HCT 41.8 06/12/2024   PLT 311.0 06/12/2024   GLUCOSE 103 (H) 06/12/2024   CHOL 206 (H) 06/12/2024   TRIG 73.0 06/12/2024   HDL 82.40 06/12/2024   LDLDIRECT 148.9 11/13/2012   LDLCALC 109 (H) 06/12/2024   ALT 16 06/12/2024   AST 21 06/12/2024   NA 141 06/12/2024   K 4.6 06/12/2024   CL 104 06/12/2024   CREATININE 0.74 06/12/2024   BUN 16 06/12/2024   CO2 31 06/12/2024   TSH 1.33 06/12/2024   HGBA1C 6.3 06/12/2024    BP Readings from Last 3 Encounters:  09/11/24 106/76  05/06/24 124/60  06/08/23 120/78    Lab results reviewed with patient   ASSESSMENT AND PLAN:  Discussed the following assessment and plan:    ICD-10-CM   1. Visit for preventive health examination  Z00.00     2. Hyperlipidemia, unspecified hyperlipidemia type  E78.5     3. Medication management  Z79.899     4. Fasting hyperglycemia  R73.01     5. Influenza vaccine needed  Z23 Flu vaccine trivalent PF, 6mos and older(Flulaval,Afluria,Fluarix,Fluzone)    6. Eosinophilic esophagitis  K20.0     Disc bg and self monitoring   statin med could cause intolerance but benefit more than risk  and attention to optimization  healthy LS Follow  a1 every 6-12 mos  Working on getting  local medical team  as possible  Disc  shingrix and Pneumococcal vaccine  Return in about 1 year (around 09/11/2025) for if not care transfer to Local team .  Patient Care Team: Elton Heid, Apolinar POUR, MD as PCP -  General Kristie Lamprey, MD as Consulting Physician (Gastroenterology) Darcel Pool, MD as Consulting Physician (Obstetrics and Gynecology) Patient Instructions   Good to see  you today   Follow  blood sugars  consider trying a CGM like free style libre or the OTc version  Call if you want a RX sent .   Can chose to do a hg A1c in 6-12 months     Agree fu with gi for endoscopy  below is  from UP TO DATE     can asks dr Kristie about budesonide  instead for fluticasone prep . And  options   Budesonide -- For adults and pediatric patients who are 41 years of age and older, the FDA-approved dose of budesonide oral suspension is 2 mg, twice daily, for 12 weeks [23,24]. Patients are instructed to take budesonide slowly, over 5 to 10 minutes, and not to eat or drink for 30 minutes after taking the medication. This budesonide formulation provides consistent drug delivery [23]. For children who are 63 years of age or younger, budesonide can be administered as an oral viscous slurry (1 to 2 mg total daily dose, generally divided into twice a day). Viscous budesonide can be compounded by mixing Pulmicort Respules with sucralose (Splenda; 10 1-gram packets per 1 mg of budesonide, creating a volume of approximately 8 mL) or another carrier vehicle that is not liquid [30]. Budesonide can also be administered as an orodispersible tablet, although this formulation may not be available in all regions [31,32].   Treatment with budesonide is generally well tolerated, and patients who respond will typically have symptomatic improvement within several days [33]. Budesonide induction therapy is followed by assessment of symptomatic response [6]. We perform upper endoscopy 8 to 12 weeks after initiating therapy to assess for endoscopic and histologic improvement [4]. (See Clinical manifestations and diagnosis of eosinophilic esophagitis (EoE), section on 'Defining disease severity'.)  Oaklie Durrett K. Berthel Bagnall M.D.

## 2024-09-12 LAB — HM MAMMOGRAPHY

## 2024-11-05 ENCOUNTER — Telehealth: Payer: Self-pay

## 2024-11-05 NOTE — Telephone Encounter (Signed)
 Copied from CRM #[id number]. Topic: Clinical - Medical Advice >> Nov 05, 2024 11:03 AM Burnard DEL wrote: Reason for CRM: Patient will be going on a trip in may to [place]. She would like to know is there any vaccines that she should get before her trip that her provider is aware of? Please advise.

## 2024-11-06 NOTE — Telephone Encounter (Signed)
 It depends      Need to go on cdc travel site and look at specific  area  and time she will be there    and go by their advice .   alternatively  health dept. Travel   can  help  .

## 2024-11-07 NOTE — Telephone Encounter (Signed)
 Went over to information below with pt. Pt verbalized understanding.

## 2024-11-27 ENCOUNTER — Other Ambulatory Visit: Payer: Self-pay | Admitting: Internal Medicine

## 2024-11-27 DIAGNOSIS — E785 Hyperlipidemia, unspecified: Secondary | ICD-10-CM
